# Patient Record
Sex: Male | Born: 1975 | Race: Black or African American | Hispanic: No | Smoking: Current every day smoker
Health system: Southern US, Community
[De-identification: ages and names within clinical notes are randomized; demographics above are authoritative.]

## PROBLEM LIST (undated history)

## (undated) DIAGNOSIS — I10 Essential (primary) hypertension: Secondary | ICD-10-CM

## (undated) DIAGNOSIS — F329 Major depressive disorder, single episode, unspecified: Secondary | ICD-10-CM

## (undated) DIAGNOSIS — M549 Dorsalgia, unspecified: Secondary | ICD-10-CM

## (undated) DIAGNOSIS — E669 Obesity, unspecified: Secondary | ICD-10-CM

## (undated) DIAGNOSIS — F319 Bipolar disorder, unspecified: Secondary | ICD-10-CM

## (undated) DIAGNOSIS — F209 Schizophrenia, unspecified: Secondary | ICD-10-CM

## (undated) DIAGNOSIS — F32A Depression, unspecified: Secondary | ICD-10-CM

## (undated) DIAGNOSIS — G4730 Sleep apnea, unspecified: Secondary | ICD-10-CM

## (undated) DIAGNOSIS — G8929 Other chronic pain: Secondary | ICD-10-CM

## (undated) DIAGNOSIS — R0602 Shortness of breath: Secondary | ICD-10-CM

## (undated) HISTORY — PX: CHOLECYSTECTOMY: SHX55

---

## 1999-03-03 ENCOUNTER — Encounter: Payer: Self-pay | Admitting: Emergency Medicine

## 1999-03-03 ENCOUNTER — Emergency Department (HOSPITAL_COMMUNITY): Admission: EM | Admit: 1999-03-03 | Discharge: 1999-03-03 | Payer: Self-pay | Admitting: Emergency Medicine

## 2003-04-21 ENCOUNTER — Emergency Department (HOSPITAL_COMMUNITY): Admission: EM | Admit: 2003-04-21 | Discharge: 2003-04-21 | Payer: Self-pay | Admitting: Emergency Medicine

## 2004-08-18 ENCOUNTER — Emergency Department (HOSPITAL_COMMUNITY): Admission: EM | Admit: 2004-08-18 | Discharge: 2004-08-18 | Payer: Self-pay | Admitting: Emergency Medicine

## 2006-02-27 ENCOUNTER — Emergency Department (HOSPITAL_COMMUNITY): Admission: EM | Admit: 2006-02-27 | Discharge: 2006-02-27 | Payer: Self-pay | Admitting: Emergency Medicine

## 2007-02-22 ENCOUNTER — Emergency Department (HOSPITAL_COMMUNITY): Admission: EM | Admit: 2007-02-22 | Discharge: 2007-02-22 | Payer: Self-pay | Admitting: Emergency Medicine

## 2007-06-19 ENCOUNTER — Emergency Department (HOSPITAL_COMMUNITY): Admission: EM | Admit: 2007-06-19 | Discharge: 2007-06-19 | Payer: Self-pay | Admitting: Emergency Medicine

## 2007-07-18 ENCOUNTER — Emergency Department (HOSPITAL_COMMUNITY): Admission: EM | Admit: 2007-07-18 | Discharge: 2007-07-18 | Payer: Self-pay | Admitting: Emergency Medicine

## 2007-07-20 ENCOUNTER — Emergency Department (HOSPITAL_COMMUNITY): Admission: EM | Admit: 2007-07-20 | Discharge: 2007-07-20 | Payer: Self-pay | Admitting: Emergency Medicine

## 2007-08-29 ENCOUNTER — Emergency Department (HOSPITAL_COMMUNITY): Admission: EM | Admit: 2007-08-29 | Discharge: 2007-08-30 | Payer: Self-pay | Admitting: Emergency Medicine

## 2007-10-14 ENCOUNTER — Emergency Department (HOSPITAL_COMMUNITY): Admission: EM | Admit: 2007-10-14 | Discharge: 2007-10-14 | Payer: Self-pay | Admitting: Emergency Medicine

## 2007-10-30 ENCOUNTER — Emergency Department (HOSPITAL_COMMUNITY): Admission: EM | Admit: 2007-10-30 | Discharge: 2007-10-31 | Payer: Self-pay | Admitting: Emergency Medicine

## 2007-12-02 ENCOUNTER — Emergency Department (HOSPITAL_COMMUNITY): Admission: EM | Admit: 2007-12-02 | Discharge: 2007-12-03 | Payer: Self-pay | Admitting: Emergency Medicine

## 2007-12-07 ENCOUNTER — Inpatient Hospital Stay (HOSPITAL_COMMUNITY): Admission: RE | Admit: 2007-12-07 | Discharge: 2007-12-08 | Payer: Self-pay | Admitting: General Surgery

## 2007-12-08 ENCOUNTER — Encounter (INDEPENDENT_AMBULATORY_CARE_PROVIDER_SITE_OTHER): Payer: Self-pay | Admitting: General Surgery

## 2008-03-16 ENCOUNTER — Emergency Department (HOSPITAL_COMMUNITY): Admission: EM | Admit: 2008-03-16 | Discharge: 2008-03-17 | Payer: Self-pay | Admitting: Emergency Medicine

## 2008-05-26 ENCOUNTER — Emergency Department (HOSPITAL_COMMUNITY): Admission: EM | Admit: 2008-05-26 | Discharge: 2008-05-26 | Payer: Self-pay | Admitting: Emergency Medicine

## 2008-08-21 ENCOUNTER — Emergency Department (HOSPITAL_COMMUNITY): Admission: EM | Admit: 2008-08-21 | Discharge: 2008-08-21 | Payer: Self-pay | Admitting: Emergency Medicine

## 2008-09-30 ENCOUNTER — Emergency Department (HOSPITAL_COMMUNITY): Admission: EM | Admit: 2008-09-30 | Discharge: 2008-09-30 | Payer: Self-pay | Admitting: Emergency Medicine

## 2008-10-15 ENCOUNTER — Emergency Department (HOSPITAL_COMMUNITY): Admission: EM | Admit: 2008-10-15 | Discharge: 2008-10-15 | Payer: Self-pay | Admitting: Emergency Medicine

## 2008-11-18 ENCOUNTER — Emergency Department (HOSPITAL_COMMUNITY): Admission: EM | Admit: 2008-11-18 | Discharge: 2008-11-18 | Payer: Self-pay | Admitting: Emergency Medicine

## 2008-12-04 ENCOUNTER — Emergency Department (HOSPITAL_COMMUNITY): Admission: EM | Admit: 2008-12-04 | Discharge: 2008-12-04 | Payer: Self-pay | Admitting: Emergency Medicine

## 2009-01-03 ENCOUNTER — Emergency Department (HOSPITAL_COMMUNITY): Admission: EM | Admit: 2009-01-03 | Discharge: 2009-01-03 | Payer: Self-pay | Admitting: Emergency Medicine

## 2010-01-17 ENCOUNTER — Emergency Department (HOSPITAL_COMMUNITY)
Admission: EM | Admit: 2010-01-17 | Discharge: 2010-01-18 | Payer: Self-pay | Source: Home / Self Care | Admitting: Emergency Medicine

## 2010-02-20 ENCOUNTER — Emergency Department (HOSPITAL_COMMUNITY)
Admission: EM | Admit: 2010-02-20 | Discharge: 2010-02-20 | Payer: Self-pay | Source: Home / Self Care | Admitting: Emergency Medicine

## 2010-04-12 LAB — COMPREHENSIVE METABOLIC PANEL
ALT: 34 U/L (ref 0–53)
Alkaline Phosphatase: 71 U/L (ref 39–117)
CO2: 30 mEq/L (ref 19–32)
Creatinine, Ser: 1.27 mg/dL (ref 0.4–1.5)
GFR calc Af Amer: >60 mL/min (ref >60)
Potassium: 2.9 mEq/L — ABNORMAL LOW (ref 3.5–5.1)

## 2010-04-12 LAB — URINE MICROSCOPIC-ADD ON

## 2010-04-12 LAB — URINALYSIS, ROUTINE W REFLEX MICROSCOPIC
Ketones, ur: 15 mg/dL — AB
Leukocytes, UA: NEGATIVE
Protein, ur: NEGATIVE mg/dL
Specific Gravity, Urine: 1.022 (ref 1.005–1.030)
Urobilinogen, UA: 1 mg/dL (ref 0.0–1.0)

## 2010-04-12 LAB — CBC
Hemoglobin: 13.4 g/dL (ref 13.0–17.0)
MCH: 29.6 pg (ref 26.0–34.0)
MCHC: 33.8 g/dL (ref 30.0–36.0)
RDW: 12.7 % (ref 11.5–15.5)
WBC: 10.3 10*3/uL (ref 4.0–10.5)

## 2010-04-12 LAB — DIFFERENTIAL
Eosinophils Absolute: 0.1 10*3/uL (ref 0.0–0.7)
Lymphs Abs: 3.6 10*3/uL (ref 0.7–4.0)

## 2010-05-06 LAB — CBC
HCT: 39 % (ref 39.0–52.0)
Hemoglobin: 13.4 g/dL (ref 13.0–17.0)
MCHC: 34.2 g/dL (ref 30.0–36.0)
MCV: 90.2 fL (ref 78.0–100.0)
RBC: 4.32 MIL/uL (ref 4.22–5.81)
WBC: 12.2 10*3/uL — ABNORMAL HIGH (ref 4.0–10.5)

## 2010-05-06 LAB — POCT I-STAT, CHEM 8
BUN: 14 mg/dL (ref 6–23)
Calcium, Ion: 1.12 mmol/L (ref 1.12–1.32)
Hemoglobin: 13.9 g/dL (ref 13.0–17.0)
TCO2: 23 mmol/L (ref 0–100)

## 2010-05-06 LAB — DIFFERENTIAL
Basophils Absolute: 0.1 10*3/uL (ref 0.0–0.1)
Eosinophils Absolute: 0.2 10*3/uL (ref 0.0–0.7)
Eosinophils Relative: 1 % (ref 0–5)
Lymphocytes Relative: 30 % (ref 12–46)
Monocytes Absolute: 0.8 10*3/uL (ref 0.1–1.0)

## 2010-05-06 LAB — URINALYSIS, ROUTINE W REFLEX MICROSCOPIC
Bilirubin Urine: NEGATIVE
Glucose, UA: NEGATIVE mg/dL
Nitrite: NEGATIVE
pH: 5.5 (ref 5.0–8.0)

## 2010-05-18 LAB — RAPID URINE DRUG SCREEN, HOSP PERFORMED
Amphetamines: NOT DETECTED
Barbiturates: NOT DETECTED
Tetrahydrocannabinol: NOT DETECTED

## 2010-06-15 NOTE — Op Note (Signed)
NAMEJESTIN, BURBACH               ACCOUNT NO.:  [id number]   MEDICAL RECORD NO.:  [id number]          PATIENT TYPE:  INP   LOCATION:  5127                         FACILITY:  [facility]   PHYSICIAN:  Lennie Muckle, MD      DATE OF BIRTH:  1975-03-08   DATE OF PROCEDURE:  DATE OF DISCHARGE:  12/08/2007                               OPERATIVE REPORT   PREOPERATIVE DIAGNOSIS:  Cholecystitis.   POSTOPERATIVE DIAGNOSIS:  Cholecystitis.   PROCEDURE:  Laparoscopic cholecystectomy.   SURGEON:  Lennie Muckle, MD   ASSISTANT:  None.   ANESTHESIA:  General endotracheal.   SPECIMENS:  Gallbladder.   FINDINGS:  Inflamed thickened gallbladder wall, multiple stones.   ESTIMATED BLOOD LOSS:  150 mL.   COMPLICATIONS:  No immediate complications.   DRAINS:  No drains were placed.   INDICATION FOR PROCEDURE:  Mr. Alyea is a 35 year old male who is  actually seen in the [facility] Emergency Department last Sunday.  He  did have right upper quadrant pain and elevated white count of 17,000.  Ultrasound revealed multiple stones within the gallbladder.  He was sent  home with pain medications and followed up with Dr. Consuello Bossier.  He continued to have epigastric discomfort right upper quadrant pain.  Findings were consistent with cholecystitis.  He was therefore admitted  for laparoscopic cholecystectomy.  Risk of the surgery included, but not  limited to bleeding, infection, biloma, hematoma, common bile duct  injury were explained to the patient.  Informed consent was obtained  prior to the procedure.  He was given Unasyn preoperatively.   DETAILS OF PROCEDURE:  Mr. Kraeger was identified in the preoperative  holding area.  All questions were answered.  He was taken to the  operating room and placed in supine position.  After administration of  general endotracheal anesthesia, his abdomen was prepped and draped in  the usual sterile fashion.  A time-out procedure indicating the patient  and procedure was performed.  I had palpated the umbilicus and felt  there was a small fascial defect.  Therefore, I placed an incision  immediately at the umbilicus.  I placed a Veress needle into the  abdominal cavity after obtaining pneumoperitoneum, I placed 11-mm trocar  in the abdomen at the umbilicus.  Visualized abdominal wall upon entry.  Inspection of the abdomen revealed no evidence of injury placement of  Veress or the trocar.  The patient was then placed in reverse  Trendelenburg left side down.  I then placed 3 ports in the right side  of the abdomen and visualization with the camera the gallbladder was  noted to be rather distended.  There were adhesions of omentum to the  gallbladder.  I gently dissected these with blunt dissection.  I  decompressed the gallbladder using the laparoscopic suction irrigator.  I then grasped the fundus and retracted the head of the patient.  The  gallbladder wall was thickened and made the dissection somewhat  difficult.  The peritoneum surrounding the gallbladder was also inflamed  and very friable.  Using the suction irrigator, hook electrocautery, and  [place], I carefully dissected around the infundibulum of the  gallbladder.  I dissected around the cystic node and pushed this  inferiorly.  I continued to dissect around the infundibulum and I was  able to isolate the cystic duct.  I located the cystic artery, which was  immediately posteromedial to the cystic duct.  I placed the clips on  this proximally and ligated this with the electrocautery distally.  I  obtained a critical view of the cystic duct and the liver bed.  I then  placed 2 clips proximally 1 distally and transected the cystic duct.  No  stones were palpated or seen in the cystic duct.  I then continued with  the dissection of the remaining peritoneum of the gallbladder.  Due to  the acute inflammation, the peritoneum was friable and the liver bed was  somewhat oozy. Using  the hook electrocautery as well as just blunt  dissection, I was able to fully dissect the gallbladder.  I placed the  gallbladder within the EndoCatch bag.  I inspected the liver bed, there  were small amount of oozing due to the inflammation.  Using  electrocautery, this was easily controlled.  I removed the bag from the  umbilical incision.  The bag did break during removal.  There was no  spillage of bile.  I had to lengthen the incision of the fascia with #11  blade.  I then removed the gallbladder.  I then once again inspected the  abdomen.  The liver bed appeared without evidence of bleeding, no bile  leak.  I irrigated the abdomen with 1 liter of saline.  I then closed  the fascial defect at the umbilicus with 0 Vicryl suture using figure-of-  eight sutures.  After closure of this, I inspected the abdomen once  again.  There was no evidence of injury and no bleeding.  I then  released the pneumoperitoneum.  Trocars were removed.  Skin was closed  with 4-0 Monocryl.  I did irrigate the umbilicus with saline.  Dermabond  placed for final dressing.  The patient was extubated, transported to  post anesthesia care unit in stable condition.  He will be monitored for  few hours if pain is controlled in keeping liquids down, he will be able  to be discharged home.      Lennie Muckle, MD  Electronically Signed     ALA/MEDQ  D:  12/08/2007  T:  12/09/2007  Job:  [id number]

## 2010-06-18 NOTE — Discharge Summary (Signed)
NAMECRISTHIAN, VANHOOK               ACCOUNT NO.:  [id number]   MEDICAL RECORD NO.:  [id number]          PATIENT TYPE:  INP   LOCATION:  5127                         FACILITY:  [facility]   PHYSICIAN:  Lennie Muckle, MD      DATE OF BIRTH:  08/13/1975   DATE OF ADMISSION:  12/07/2007  DATE OF DISCHARGE:  12/08/2007                               DISCHARGE SUMMARY   PROCEDURE:  On December 08, 2007, laparoscopic cholecystectomy and  cholangiogram.   HOSPITAL COURSE:  Mr. Biehl was admitted on Friday for cholecystitis.  He received cholecystectomy on Saturday.  He had an uneventful  postoperative course.  He is going to be discharged and sent home on the  same day without problem.  Pain controlled and he is having adequate  intake.      Lennie Muckle, MD  Electronically Signed     ALA/MEDQ  D:  01/23/2008  T:  01/24/2008  Job:  [id number]

## 2010-06-20 ENCOUNTER — Emergency Department (HOSPITAL_COMMUNITY): Payer: Self-pay

## 2010-06-20 ENCOUNTER — Emergency Department (HOSPITAL_COMMUNITY)
Admission: EM | Admit: 2010-06-20 | Discharge: 2010-06-20 | Disposition: A | Discharge status: Home / Self Care | Payer: Self-pay | Attending: Emergency Medicine | Admitting: Emergency Medicine

## 2010-06-20 DIAGNOSIS — K219 Gastro-esophageal reflux disease without esophagitis: Secondary | ICD-10-CM | POA: Insufficient documentation

## 2010-06-20 DIAGNOSIS — Y92009 Unspecified place in unspecified non-institutional (private) residence as the place of occurrence of the external cause: Secondary | ICD-10-CM | POA: Insufficient documentation

## 2010-06-20 DIAGNOSIS — W010XXA Fall on same level from slipping, tripping and stumbling without subsequent striking against object, initial encounter: Secondary | ICD-10-CM | POA: Insufficient documentation

## 2010-06-20 DIAGNOSIS — J069 Acute upper respiratory infection, unspecified: Secondary | ICD-10-CM | POA: Insufficient documentation

## 2010-06-20 DIAGNOSIS — Z79899 Other long term (current) drug therapy: Secondary | ICD-10-CM | POA: Insufficient documentation

## 2010-06-20 DIAGNOSIS — G8929 Other chronic pain: Secondary | ICD-10-CM | POA: Insufficient documentation

## 2010-06-20 DIAGNOSIS — M25579 Pain in unspecified ankle and joints of unspecified foot: Secondary | ICD-10-CM | POA: Insufficient documentation

## 2010-06-20 DIAGNOSIS — R030 Elevated blood-pressure reading, without diagnosis of hypertension: Secondary | ICD-10-CM | POA: Insufficient documentation

## 2010-06-20 DIAGNOSIS — M25569 Pain in unspecified knee: Secondary | ICD-10-CM | POA: Insufficient documentation

## 2010-09-26 ENCOUNTER — Emergency Department (HOSPITAL_COMMUNITY): Payer: Self-pay

## 2010-09-26 ENCOUNTER — Emergency Department (HOSPITAL_COMMUNITY)
Admission: EM | Admit: 2010-09-26 | Discharge: 2010-09-26 | Disposition: A | Discharge status: Home / Self Care | Payer: Self-pay | Attending: Emergency Medicine | Admitting: Emergency Medicine

## 2010-09-26 DIAGNOSIS — M79609 Pain in unspecified limb: Secondary | ICD-10-CM | POA: Insufficient documentation

## 2010-09-26 DIAGNOSIS — M25519 Pain in unspecified shoulder: Secondary | ICD-10-CM | POA: Insufficient documentation

## 2010-09-26 DIAGNOSIS — M25539 Pain in unspecified wrist: Secondary | ICD-10-CM | POA: Insufficient documentation

## 2010-09-26 DIAGNOSIS — S62329A Displaced fracture of shaft of unspecified metacarpal bone, initial encounter for closed fracture: Secondary | ICD-10-CM | POA: Insufficient documentation

## 2010-09-26 DIAGNOSIS — M25529 Pain in unspecified elbow: Secondary | ICD-10-CM | POA: Insufficient documentation

## 2010-09-26 DIAGNOSIS — IMO0002 Reserved for concepts with insufficient information to code with codable children: Secondary | ICD-10-CM | POA: Insufficient documentation

## 2010-09-26 DIAGNOSIS — K219 Gastro-esophageal reflux disease without esophagitis: Secondary | ICD-10-CM | POA: Insufficient documentation

## 2010-09-26 DIAGNOSIS — M542 Cervicalgia: Secondary | ICD-10-CM | POA: Insufficient documentation

## 2010-09-26 DIAGNOSIS — L909 Atrophic disorder of skin, unspecified: Secondary | ICD-10-CM | POA: Insufficient documentation

## 2010-10-28 LAB — POCT I-STAT, CHEM 8
HCT: 38 — ABNORMAL LOW
Hemoglobin: 12.9 — ABNORMAL LOW
Potassium: 3.5
Sodium: 138

## 2010-11-02 LAB — URINE MICROSCOPIC-ADD ON

## 2010-11-02 LAB — COMPREHENSIVE METABOLIC PANEL
ALT: 15
Albumin: 4
Albumin: 3.5
Alkaline Phosphatase: 71
BUN: 6
BUN: 5 — ABNORMAL LOW
CO2: 28
Chloride: 107
Creatinine, Ser: 1.19
GFR calc non Af Amer: >60
Glucose, Bld: 90
Potassium: 3.9
Sodium: 140
Total Bilirubin: 0.5
Total Protein: 6.8

## 2010-11-02 LAB — URINALYSIS, ROUTINE W REFLEX MICROSCOPIC
Leukocytes, UA: NEGATIVE
Nitrite: NEGATIVE
Specific Gravity, Urine: 1.029
Urobilinogen, UA: 1

## 2010-11-02 LAB — DIFFERENTIAL
Basophils Relative: 0
Monocytes Absolute: 0.9
Monocytes Relative: 5
Neutro Abs: 14 — ABNORMAL HIGH

## 2010-11-02 LAB — CBC
HCT: 37.9 — ABNORMAL LOW
MCV: 91.9
Platelets: 362
Platelets: 329
RDW: 12.7
WBC: 9

## 2010-12-14 ENCOUNTER — Emergency Department (HOSPITAL_COMMUNITY): Payer: Self-pay

## 2010-12-14 ENCOUNTER — Emergency Department (HOSPITAL_COMMUNITY)
Admission: EM | Admit: 2010-12-14 | Discharge: 2010-12-14 | Disposition: A | Discharge status: Home / Self Care | Payer: Self-pay | Attending: Emergency Medicine | Admitting: Emergency Medicine

## 2010-12-14 ENCOUNTER — Encounter: Payer: Self-pay | Admitting: Emergency Medicine

## 2010-12-14 DIAGNOSIS — R109 Unspecified abdominal pain: Secondary | ICD-10-CM | POA: Insufficient documentation

## 2010-12-14 DIAGNOSIS — E669 Obesity, unspecified: Secondary | ICD-10-CM | POA: Insufficient documentation

## 2010-12-14 HISTORY — DX: Obesity, unspecified: E66.9

## 2010-12-14 LAB — CBC
Platelets: 340 10*3/uL (ref 150–400)
RBC: 4.54 MIL/uL (ref 4.22–5.81)
RDW: 12.9 % (ref 11.5–15.5)
WBC: 10.6 10*3/uL — ABNORMAL HIGH (ref 4.0–10.5)

## 2010-12-14 LAB — DIFFERENTIAL
Basophils Absolute: 0.1 10*3/uL (ref 0.0–0.1)
Eosinophils Absolute: 0.1 10*3/uL (ref 0.0–0.7)
Lymphocytes Relative: 28 % (ref 12–46)
Lymphs Abs: 3 10*3/uL (ref 0.7–4.0)
Neutrophils Relative %: 65 % (ref 43–77)

## 2010-12-14 LAB — URINALYSIS, ROUTINE W REFLEX MICROSCOPIC
Nitrite: NEGATIVE
Specific Gravity, Urine: 1.028 (ref 1.005–1.030)
Urobilinogen, UA: 1 mg/dL (ref 0.0–1.0)

## 2010-12-14 LAB — URINE MICROSCOPIC-ADD ON

## 2010-12-14 LAB — BASIC METABOLIC PANEL
BUN: 13 mg/dL (ref 6–23)
GFR calc Af Amer: 75 mL/min — ABNORMAL LOW (ref >90)
GFR calc non Af Amer: 64 mL/min — ABNORMAL LOW (ref >90)
Potassium: 3 mEq/L — ABNORMAL LOW (ref 3.5–5.1)

## 2010-12-14 MED ORDER — HYDROMORPHONE HCL PF 1 MG/ML IJ SOLN
1.0000 mg | Freq: Once | INTRAMUSCULAR | Status: AC
  Administered 2010-12-14: 1 mg via INTRAVENOUS
  Filled 2010-12-14: qty 1

## 2010-12-14 MED ORDER — ONDANSETRON HCL 4 MG/2ML IJ SOLN
4.0000 mg | Freq: Once | INTRAMUSCULAR | Status: AC
  Administered 2010-12-14: 4 mg via INTRAVENOUS
  Filled 2010-12-14: qty 2

## 2010-12-14 MED ORDER — ACETAMINOPHEN-CODEINE #3 300-30 MG PO TABS: 1.0000 | ORAL_TABLET | Freq: Four times a day (QID) | ORAL | Status: AC | PRN

## 2010-12-14 MED ORDER — IOHEXOL 300 MG/ML  SOLN
80.0000 mL | Freq: Once | INTRAMUSCULAR | Status: AC | PRN
  Administered 2010-12-14: 80 mL via INTRAVENOUS

## 2010-12-14 NOTE — ED Notes (Signed)
PT. REPORTS GENERALIZED ABDOMINAL PAIN X SEVERAL MONTHS WORSE THIS EVENING ,  NO NAUSEA OR VOMITTING ,  OCCASIONAL DIARRHEA ,  NO FEVER OR CHILLS.

## 2010-12-14 NOTE — ED Provider Notes (Signed)
History     CSN: [id number] Arrival date & time: 12/14/2010  3:41 AM   First MD Initiated Contact with Patient 12/14/10 [phone]      Chief Complaint  Patient presents with  . Abdominal Pain    (Consider location/radiation/quality/duration/timing/severity/associated sxs/prior treatment) HPI  Patient who states he had cholecystectomy 2 years ago by Dr. Bertram Savin complains of a 2 year hx of intermittent waxing and waning diffuse abdominal pain since having that procedure stating that he returned to see Dr. Freida Busman shortly after surgery but has not followed up since. Patient states that since last night increasing severity of abdominal pain. States some loose stool occassionally but denies nausea or vomiting. Denies known fevers or chills. Patient has not taken anything PTA for pain. States pain is aggravated by eating. Denies alleviating factors.   Past Medical History  Diagnosis Date  . Obesity     Past Surgical History  Procedure Date  . Cholecystectomy     No family history on file.  History  Substance Use Topics  . Smoking status: Current Everyday Smoker  . Smokeless tobacco: Not on file  . Alcohol Use: Yes     OCCASIONAL      Review of Systems  All other systems reviewed and are negative.    Allergies  Hydrocodone  Home Medications  No current outpatient prescriptions on file.  BP 161/104  Pulse 88  Temp(Src) 98.8 F (37.1 C) (Oral)  Resp 17  SpO2 93%  Physical Exam  Nursing note and vitals reviewed. Constitutional: He is oriented to person, place, and time. He appears well-developed and well-nourished.  Non-toxic appearance. He does not have a sickly appearance. No distress.       obese  HENT:  Head: Normocephalic and atraumatic.  Eyes: Conjunctivae are normal.  Neck: Normal range of motion. Neck supple.  Cardiovascular: Normal rate, regular rhythm, normal heart sounds and intact distal pulses.  Exam reveals no gallop and no friction rub.   No  murmur heard. Pulmonary/Chest: Effort normal and breath sounds normal. No respiratory distress. He has no wheezes. He has no rales. He exhibits no tenderness.  Abdominal: Bowel sounds are normal. He exhibits no distension and no mass. There is tenderness. There is no rebound and no guarding.       Diffuse abdominal TTP but greatest pain in lower abdomen. No peritoneal signs and no rebound.   Musculoskeletal: Normal range of motion. He exhibits no edema and no tenderness.  Neurological: He is alert and oriented to person, place, and time.  Skin: Skin is warm and dry. No rash noted. He is not diaphoretic. No erythema.  Psychiatric: He has a normal mood and affect.    ED Course  Procedures (including critical care time)  IV dilaudid and zofran  Labs Reviewed  URINALYSIS, ROUTINE W REFLEX MICROSCOPIC - Abnormal; Notable for the following:    Color, Urine AMBER (*) BIOCHEMICALS MAY BE AFFECTED BY COLOR   Bilirubin Urine SMALL (*)    Ketones, ur 15 (*)    Protein, ur 30 (*)    All other components within normal limits  CBC - Abnormal; Notable for the following:    WBC 10.6 (*)    All other components within normal limits  BASIC METABOLIC PANEL - Abnormal; Notable for the following:    Potassium 3.0 (*)    Creatinine, Ser 1.39 (*)    GFR calc non Af Amer 64 (*)    GFR calc Af Amer 75 (*)  All other components within normal limits  URINE MICROSCOPIC-ADD ON - Abnormal; Notable for the following:    Bacteria, UA FEW (*)    Casts HYALINE CASTS (*)    Crystals CA OXALATE CRYSTALS (*)    All other components within normal limits  DIFFERENTIAL  LIPASE, BLOOD   Dg Chest 1 View  12/14/2010  *RADIOLOGY REPORT*  Clinical Data: Abdominal pain.  Somnolence.  CHEST - 1 VIEW  Comparison: 06/20/2010.  Findings: Low volume chest.  Bilateral basilar atelectasis.  No airspace disease.  No effusion.  IMPRESSION: Low volume chest.  Original Report Authenticated By: Andreas Newport, M.D.   Ct Abdomen  Pelvis W Contrast  12/14/2010  *RADIOLOGY REPORT*  Clinical Data: Severe right lower quadrant pain, nausea, rebound  CT ABDOMEN AND PELVIS WITH CONTRAST  Technique:  Multidetector CT imaging of the abdomen and pelvis was performed following the standard protocol during bolus administration of intravenous contrast.  Contrast: 80mL OMNIPAQUE IOHEXOL 300 MG/ML IV SOLN  Comparison: CT abdomen pelvis of 01/17/2010  Findings: Minimal parenchymal opacity peripherally at the right lung base may reflect atelectasis.  No effusion is seen.  The liver again is noted to be low in attenuation consistent with fatty infiltration.  No focal abnormality is seen.  Surgical clips are present from prior cholecystectomy.  The pancreas is normal in size and the pancreatic duct is not dilated.  The adrenal glands and spleen are unremarkable.  The kidneys enhance with no calculus or mass and no hydronephrosis is seen.  The abdominal aorta is normal in caliber.  No adenopathy is seen.  The urinary bladder is not well distended.  The prostate is normal in size.  No fluid is seen within the pelvis.  No abnormality of the colon is seen.  The terminal ileum is unremarkable.  The appendix fills with air and there is no present evidence of acute appendicitis. No bony abnormality is seen.  IMPRESSION:  1.  No explanation for the patient's pain is seen.  The appendix is well seen and appears normal. 2.  Probable atelectasis peripherally at the right lung base. Correlate clinically.  Original Report Authenticated By: Juline Patch, M.D.     1. Abdominal pain       MDM  2 years of intermittent abdominal pain without acute findings on CT scan and patient afebrile and non toxic appearing. No peritoneal signs. Patient agreeable to following up with PCP for recheck of recurrent abdominal pain and recheck of blood pressure.         Lenon Oms [place], [place] 12/14/10 [phone]

## 2010-12-14 NOTE — ED Provider Notes (Signed)
Medical screening examination/treatment/procedure(s) were performed by non-physician practitioner and as supervising physician I was immediately available for consultation/collaboration.  Olivia Mackie, MD 12/14/10 [phone]

## 2010-12-14 NOTE — ED Notes (Signed)
Pt. Discharged to home with wife pt. Alert and oriented, NAD noted

## 2010-12-14 NOTE — ED Notes (Signed)
Pt.bulated to bathroom with 1 assist. Gait slightly unsteady, pt. Alert and oriented, returned to stretcher, awaiting CT results

## 2011-05-24 ENCOUNTER — Inpatient Hospital Stay (HOSPITAL_COMMUNITY)
Admission: EM | Admit: 2011-05-24 | Discharge: 2011-05-26 | DRG: 189 | Discharge status: Against Medical Advice | Payer: Self-pay | Attending: Internal Medicine | Admitting: Internal Medicine

## 2011-05-24 ENCOUNTER — Encounter (HOSPITAL_COMMUNITY): Payer: Self-pay | Admitting: Emergency Medicine

## 2011-05-24 ENCOUNTER — Emergency Department (HOSPITAL_COMMUNITY): Payer: Self-pay

## 2011-05-24 DIAGNOSIS — G4730 Sleep apnea, unspecified: Secondary | ICD-10-CM

## 2011-05-24 DIAGNOSIS — E876 Hypokalemia: Secondary | ICD-10-CM

## 2011-05-24 DIAGNOSIS — F1910 Other psychoactive substance abuse, uncomplicated: Secondary | ICD-10-CM

## 2011-05-24 DIAGNOSIS — I10 Essential (primary) hypertension: Secondary | ICD-10-CM | POA: Diagnosis present

## 2011-05-24 DIAGNOSIS — Z6841 Body Mass Index (BMI) 40.0 and over, adult: Secondary | ICD-10-CM

## 2011-05-24 DIAGNOSIS — R0681 Apnea, not elsewhere classified: Secondary | ICD-10-CM | POA: Diagnosis present

## 2011-05-24 DIAGNOSIS — F1410 Cocaine abuse, uncomplicated: Secondary | ICD-10-CM

## 2011-05-24 DIAGNOSIS — G929 Unspecified toxic encephalopathy: Secondary | ICD-10-CM | POA: Diagnosis present

## 2011-05-24 DIAGNOSIS — R4182 Altered mental status, unspecified: Secondary | ICD-10-CM

## 2011-05-24 DIAGNOSIS — F1510 Other stimulant abuse, uncomplicated: Secondary | ICD-10-CM | POA: Diagnosis present

## 2011-05-24 DIAGNOSIS — J9600 Acute respiratory failure, unspecified whether with hypoxia or hypercapnia: Principal | ICD-10-CM | POA: Diagnosis present

## 2011-05-24 DIAGNOSIS — G92 Toxic encephalopathy: Secondary | ICD-10-CM | POA: Diagnosis present

## 2011-05-24 DIAGNOSIS — M6282 Rhabdomyolysis: Secondary | ICD-10-CM | POA: Diagnosis present

## 2011-05-24 HISTORY — DX: Shortness of breath: R06.02

## 2011-05-24 HISTORY — DX: Essential (primary) hypertension: I10

## 2011-05-24 LAB — CBC
HCT: 43.8 % (ref 39.0–52.0)
Hemoglobin: 15.2 g/dL (ref 13.0–17.0)
MCH: 30.2 pg (ref 26.0–34.0)
MCHC: 34.7 g/dL (ref 30.0–36.0)
MCV: 86.9 fL (ref 78.0–100.0)

## 2011-05-24 LAB — POCT I-STAT 3, ART BLOOD GAS (G3+)
Acid-Base Excess: 3 mmol/L — ABNORMAL HIGH (ref 0.0–2.0)
Bicarbonate: 27.5 mEq/L — ABNORMAL HIGH (ref 20.0–24.0)
O2 Saturation: 99 %
Patient temperature: 98.6
TCO2: 29 mmol/L (ref 0–100)
pO2, Arterial: 118 mmHg — ABNORMAL HIGH (ref 80.0–100.0)

## 2011-05-24 LAB — CARDIAC PANEL(CRET KIN+CKTOT+MB+TROPI)
CK, MB: 7.6 ng/mL (ref 0.3–4.0)
Troponin I: <0.3 ng/mL (ref <0.30)

## 2011-05-24 LAB — COMPREHENSIVE METABOLIC PANEL
Albumin: 4.5 g/dL (ref 3.5–5.2)
BUN: 11 mg/dL (ref 6–23)
Creatinine, Ser: 1.5 mg/dL — ABNORMAL HIGH (ref 0.50–1.35)
GFR calc Af Amer: 68 mL/min — ABNORMAL LOW (ref >90)
Glucose, Bld: 79 mg/dL (ref 70–99)
Total Bilirubin: 0.4 mg/dL (ref 0.3–1.2)
Total Protein: 8.1 g/dL (ref 6.0–8.3)

## 2011-05-24 LAB — DIFFERENTIAL
Basophils Relative: 0 % (ref 0–1)
Eosinophils Absolute: 0.2 10*3/uL (ref 0.0–0.7)
Eosinophils Relative: 1 % (ref 0–5)
Monocytes Absolute: 0.5 10*3/uL (ref 0.1–1.0)
Monocytes Relative: 4 % (ref 3–12)

## 2011-05-24 LAB — RAPID URINE DRUG SCREEN, HOSP PERFORMED
Cocaine: POSITIVE — AB
Opiates: NOT DETECTED
Tetrahydrocannabinol: NOT DETECTED

## 2011-05-24 LAB — GLUCOSE, CAPILLARY: Glucose-Capillary: 87 mg/dL (ref 70–99)

## 2011-05-24 LAB — CK: Total CK: 1588 U/L — ABNORMAL HIGH (ref 7–232)

## 2011-05-24 LAB — ETHANOL: Alcohol, Ethyl (B): <11 mg/dL (ref 0–11)

## 2011-05-24 MED ORDER — HEPARIN SODIUM (PORCINE) 5000 UNIT/ML IJ SOLN
5000.0000 [IU] | Freq: Three times a day (TID) | INTRAMUSCULAR | Status: DC
  Administered 2011-05-24 – 2011-05-26 (×7): 5000 [IU] via SUBCUTANEOUS
  Filled 2011-05-24 (×9): qty 1

## 2011-05-24 MED ORDER — POTASSIUM CHLORIDE IN NACL 40-0.9 MEQ/L-% IV SOLN
INTRAVENOUS | Status: DC
  Administered 2011-05-24 – 2011-05-25 (×2): via INTRAVENOUS
  Filled 2011-05-24 (×7): qty 1000

## 2011-05-24 MED ORDER — NALOXONE HCL 1 MG/ML IJ SOLN
INTRAMUSCULAR | Status: AC
  Administered 2011-05-24: 09:00:00
  Filled 2011-05-24: qty 2

## 2011-05-24 MED ORDER — POTASSIUM CHLORIDE 10 MEQ/100ML IV SOLN
10.0000 meq | Freq: Once | INTRAVENOUS | Status: AC
  Administered 2011-05-24: 10 meq via INTRAVENOUS
  Filled 2011-05-24: qty 100

## 2011-05-24 MED ORDER — POTASSIUM CHLORIDE CRYS ER 20 MEQ PO TBCR: 60.0000 meq | EXTENDED_RELEASE_TABLET | Freq: Once | ORAL | Status: DC

## 2011-05-24 MED ORDER — SODIUM CHLORIDE 0.9 % IV BOLUS (SEPSIS)
1000.0000 mL | Freq: Once | INTRAVENOUS | Status: AC
  Administered 2011-05-24: 1000 mL via INTRAVENOUS

## 2011-05-24 MED ORDER — NALOXONE HCL 0.4 MG/ML IJ SOLN: 0.4000 mg | Freq: Once | INTRAMUSCULAR | Status: DC

## 2011-05-24 MED ORDER — HYDRALAZINE HCL 20 MG/ML IJ SOLN
10.0000 mg | INTRAMUSCULAR | Status: DC | PRN
  Administered 2011-05-24: 10 mg via INTRAVENOUS
  Filled 2011-05-24: qty 0.5

## 2011-05-24 MED ORDER — POTASSIUM CHLORIDE 10 MEQ/100ML IV SOLN
10.0000 meq | INTRAVENOUS | Status: AC
  Administered 2011-05-24 (×3): 10 meq via INTRAVENOUS
  Filled 2011-05-24 (×2): qty 100

## 2011-05-24 MED ORDER — HYDRALAZINE HCL 20 MG/ML IJ SOLN
20.0000 mg | INTRAMUSCULAR | Status: DC | PRN
  Administered 2011-05-24 (×2): 20 mg via INTRAVENOUS
  Filled 2011-05-24 (×4): qty 1

## 2011-05-24 MED ORDER — ENOXAPARIN SODIUM 40 MG/0.4ML ~~LOC~~ SOLN: 40.0000 mg | SUBCUTANEOUS | Status: DC

## 2011-05-24 NOTE — ED Notes (Signed)
Critical Care MD at bedside.  
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
## 2011-05-24 NOTE — ED Notes (Signed)
Family at bedside. 

## 2011-05-24 NOTE — ED Notes (Signed)
Pt increasingly difficulty to arouse, having frequent  de-sat events with apparent sleep apnea, will notify MD

## 2011-05-24 NOTE — Progress Notes (Signed)
eLink Physician-Brief Progress Note Patient Name: Ryan Horn DOB: [birth year] MRN: [id number]  Date of Service  05/24/2011   HPI/Events of Note  Pt needs DVT proph   eICU Interventions  Start Heparin Sq    Intervention Category Intermediate Interventions: Best-practice therapies (e.g. DVT, beta blocker, etc.)  Shan Levans 05/24/2011, 5:17 PM

## 2011-05-24 NOTE — Progress Notes (Signed)
eLink Physician-Brief Progress Note Patient Name: Ryan Horn DOB: 1975-08-17 MRN: [id number]  Date of Service  05/24/2011   HPI/Events of Note   Pt hypertensive  eICU Interventions  Will increase hydralazine   Intervention Category Major Interventions: Hypertension - evaluation and management  Shan Levans 05/24/2011, 5:08 PM
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
## 2011-05-24 NOTE — ED Notes (Signed)
Pt awake, asking questions, states he wants to go home. Speech slurred and pt falls asleep during sentences.
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
## 2011-05-24 NOTE — ED Notes (Signed)
[employer] in room with pt.

## 2011-05-24 NOTE — ED Notes (Signed)
RN on 2600 will return call for pt report

## 2011-05-24 NOTE — ED Notes (Signed)
Pt responds to verbal stimuli, unable to complete sentences before falling asleep. MD notified, orders given.

## 2011-05-24 NOTE — ED Notes (Signed)
Pt back from CT monitors in place

## 2011-05-24 NOTE — ED Notes (Signed)
Pt transported to CT by RN.

## 2011-05-24 NOTE — ED Notes (Signed)
Dr. Freida Busman notified of critical value
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
## 2011-05-24 NOTE — Progress Notes (Signed)
eLink Physician-Brief Progress Note Patient Name: Ryan Horn DOB: 05-02-1975 MRN: [id number]  Date of Service  05/24/2011   HPI/Events of Note   K 2.9  Will need more KCL replacement, cannot take PO  eICU Interventions  IV KCL ordered    Intervention Category Major Interventions: Electrolyte abnormality - evaluation and management  Shan Levans 05/24/2011, 5:05 PM

## 2011-05-24 NOTE — Progress Notes (Signed)
Paged to ED -08 to place on cpap, upon arrival pt. displaying OSA with sats in the 70's on 2lpm n/c, placed on PRB mask @ 15lpm sats > to the >90's, placed on V-60 Bipap mode for BUR, VT 700's when airway open,  tolerating well, RN at bedside along with [employer].

## 2011-05-24 NOTE — ED Notes (Signed)
Pt requesting visit from chaplin. Will page chaplin on call

## 2011-05-24 NOTE — H&P (Signed)
PCP:   No primary provider on file.   Chief Complaint:  ams  HPI: 36 yo man with unknown pmhx was found unresponsive in his car  He probably took some illicit substances and then had aloc. In the ED was evaluated and found to have respiratory failure and apnea and was referred for admission Cannot stay awake for longer than 1 second Non verbal   Review of Systems:  unobtainable  Past Medical History: Past Medical History  Diagnosis Date  . Obesity    Past Surgical History  Procedure Date  . Cholecystectomy     Medications: Prior to Admission medications   Not on File    Allergies:   Allergies  Allergen Reactions  . Hydrocodone Itching    'MAKES ME [facility]"    Social History:  reports that he has been smoking.  He does not have any smokeless tobacco history on file. He reports that he drinks alcohol. He reports that he uses illicit drugs.  History   Social History Narrative  . No narrative on file     Family History: No family history on file.  Physical Exam: Filed Vitals:   05/24/11 1400 05/24/11 1415 05/24/11 1430 05/24/11 1508  BP: 229/116 157/137 180/125 186/88  Pulse: 71 77 84 90  Temp:      TempSrc:      Resp:    28  Height:      Weight:      SpO2: 86% 82% 96% 99%   Morbid obesity  Conjunctiva with erythema Abdomen : soft LE plus 2 edema CVS: tachy, regular no murmur  RS: paradoxic movement Skin without rashes Neuro - unable to perform   Labs on Admission:   [facility] 05/24/11 0904  NA 143  K 2.5*  CL 102  CO2 29  GLUCOSE 79  BUN 11  CREATININE 1.50*  CALCIUM 9.5  MG --  PHOS --    Basename 05/24/11 0904  AST 36  ALT 24  ALKPHOS 93  BILITOT 0.4  PROT 8.1  ALBUMIN 4.5   No results found for this basename: LIPASE:2,AMYLASE:2 in the last 72 hours  Basename 05/24/11 0904  WBC 11.8*  NEUTROABS 7.3  HGB 15.2  HCT 43.8  MCV 86.9  PLT 377   Results for LEOR, WHYTE (MRN [id number]) as of 05/24/2011 15:38  Ref. Range  05/24/2011 15:13  Sample type No range found ARTERIAL  pH, Arterial Latest Range: 7.350-7.450  7.451 (H)  pCO2 arterial Latest Range: 35.0-45.0 mmHg 39.5  pO2, Arterial Latest Range: 80.0-100.0 mmHg 118.0 (H)  Bicarbonate Latest Range: 20.0-24.0 mEq/L 27.5 (H)  TCO2 Latest Range: 0-100 mmol/L 29  Acid-Base Excess Latest Range: 0.0-2.0 mmol/L 3.0 (H)  O2 Saturation No range found 99.0  Patient temperature No range found 98.6 F  Collection site No range found RADIAL, ALLEN'S TEST ACCEPTABLE      Basename 05/24/11 0904  CKTOTAL 1588*  CKMB --  CKMBINDEX --  TROPONINI --   No results found for this basename: TSH,T4TOTAL,FREET3,T3FREE,THYROIDAB in the last 72 hours No results found for this basename: VITAMINB12:2,FOLATE:2,FERRITIN:2,TIBC:2,IRON:2,RETICCTPCT:2 in the last 72 hours  Radiological Exams on Admission: Ct Head Wo Contrast  05/24/2011  *RADIOLOGY REPORT*  Clinical Data: Altered mental status.  Overdose.  CT HEAD WITHOUT CONTRAST  Technique:  Contiguous axial images were obtained from the base of the skull through the vertex without contrast.  Comparison: Head CT scan 03/16/2008.  Findings: Patient motion somewhat degrades the study but no evidence of intracranial  abnormality including infarction, hemorrhage, mass lesion, mass effect, midline shift or abnormal excessive fluid collection.  No hydrocephalus or pneumocephalus. Partial visualization of a mucous retention cyst or polyp right maxillary sinus noted.  IMPRESSION: No acute intracranial abnormality.  Original Report Authenticated By: Bernadene Bell. Maricela Curet, M.D.    Assessment/Plan Present on Admission:  .Altered mental status .Cocaine abuse .Hypokalemia .Polysubstance abuse .Acute respiratory failure .Apnea .Rhabdomyolysis  AMS - Probable dueTME - DDx includes also storke - although less likely due to CT scan WNL   Acute resp failure due to aloc and OSA - pCCM seen the patient and they did not recommended intubation -  patient to be started on BiPap  Hypokalemia - replete  Rhabdo - mild - ivf. F/u ck     Hameed Kolar 05/24/2011, 3:40 PM

## 2011-05-24 NOTE — ED Notes (Signed)
EMS gave pt 0.4mg  Narcan pta with no effect

## 2011-05-24 NOTE — Progress Notes (Addendum)
CRITICAL VALUE ALERT  Critical value received:  CKMB 7.6  Date of notification:  4/23  Time of notification:  1950  Critical value read back:yes  Nurse who received alert:  A. Canary Brim RN  MD notified (1st page):  Lavera Guise MD  Time of first page:  1950  MD notified (2nd page): Craige Cotta NP  Time of second page:2026  Responding MD:  Delford Field MD  Time MD responded:  2050 No new orders, will continue to monitor.

## 2011-05-24 NOTE — ED Notes (Signed)
Pt removed Bi-Pap and refused to wear it. Attempted non rebreather and pt refuses to wear. O2 5L [place] in place now. MD aware.

## 2011-05-24 NOTE — ED Notes (Signed)
Respiratory in room at this time placing pt on bi-pap for sleep apnea.

## 2011-05-24 NOTE — Progress Notes (Signed)
Chaplain's Note:  14:50 went to ED to respond to pt request.  Remained outside rm while pt having blood-draw/ treatment.  When I entered the room to introduce myself, pt was sleeping, snoring, and could not stay awake for a visit.  Advised staff we would be glad to provide support once pt is awake, and if he still requests support.  Please page if needed or requested.
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
## 2011-05-25 ENCOUNTER — Encounter (HOSPITAL_COMMUNITY): Payer: Self-pay | Admitting: *Deleted

## 2011-05-25 ENCOUNTER — Inpatient Hospital Stay (HOSPITAL_COMMUNITY): Payer: Self-pay

## 2011-05-25 DIAGNOSIS — J9600 Acute respiratory failure, unspecified whether with hypoxia or hypercapnia: Principal | ICD-10-CM

## 2011-05-25 DIAGNOSIS — R0681 Apnea, not elsewhere classified: Secondary | ICD-10-CM

## 2011-05-25 LAB — CBC
HCT: 40.2 % (ref 39.0–52.0)
MCH: 30.1 pg (ref 26.0–34.0)
MCV: 87.6 fL (ref 78.0–100.0)
RDW: 13.1 % (ref 11.5–15.5)
WBC: 14 10*3/uL — ABNORMAL HIGH (ref 4.0–10.5)

## 2011-05-25 LAB — BASIC METABOLIC PANEL
CO2: 28 mEq/L (ref 19–32)
Chloride: 105 mEq/L (ref 96–112)
Creatinine, Ser: 1.59 mg/dL — ABNORMAL HIGH (ref 0.50–1.35)
Glucose, Bld: 155 mg/dL — ABNORMAL HIGH (ref 70–99)

## 2011-05-25 LAB — CARDIAC PANEL(CRET KIN+CKTOT+MB+TROPI)
CK, MB: 5.3 ng/mL — ABNORMAL HIGH (ref 0.3–4.0)
Total CK: 752 U/L — ABNORMAL HIGH (ref 7–232)
Total CK: 551 U/L — ABNORMAL HIGH (ref 7–232)
Troponin I: <0.3 ng/mL (ref <0.30)

## 2011-05-25 MED ORDER — CLONIDINE HCL 0.1 MG PO TABS
0.1000 mg | ORAL_TABLET | Freq: Three times a day (TID) | ORAL | Status: DC
  Administered 2011-05-26: 0.1 mg via ORAL
  Filled 2011-05-25 (×4): qty 1

## 2011-05-25 MED ORDER — POTASSIUM CHLORIDE 10 MEQ/100ML IV SOLN
10.0000 meq | INTRAVENOUS | Status: AC
  Administered 2011-05-25 (×4): 10 meq via INTRAVENOUS
  Filled 2011-05-25 (×4): qty 100

## 2011-05-25 MED ORDER — HYDRALAZINE HCL 20 MG/ML IJ SOLN
10.0000 mg | INTRAMUSCULAR | Status: DC | PRN
  Administered 2011-05-25 – 2011-05-26 (×3): 10 mg via INTRAVENOUS
  Filled 2011-05-25: qty 0.5

## 2011-05-25 MED ORDER — FUROSEMIDE 10 MG/ML IJ SOLN
40.0000 mg | Freq: Two times a day (BID) | INTRAMUSCULAR | Status: DC
  Administered 2011-05-25 – 2011-05-26 (×2): 40 mg via INTRAVENOUS
  Filled 2011-05-25 (×4): qty 4

## 2011-05-25 MED ORDER — POTASSIUM CHLORIDE CRYS ER 20 MEQ PO TBCR
40.0000 meq | EXTENDED_RELEASE_TABLET | Freq: Once | ORAL | Status: AC
  Administered 2011-05-25: 40 meq via ORAL
  Filled 2011-05-25: qty 2

## 2011-05-25 NOTE — Progress Notes (Signed)
CRITICAL VALUE ALERT  Critical value received:  Troponin: 0.51  Date of notification:  *05/25/2011  Time of notification:  1050  Critical value read back:yes  Nurse who received alert:  Martin Belling, RN. Pt's nurse TATA, RN  is made aware of and she will page MD.  MD notified (1st page):    Time of first page:    MD notified (2nd page):  Time of second page:  Responding MD:    Time MD responded:

## 2011-05-25 NOTE — Plan of Care (Signed)
Problem: Phase I Progression Outcomes Goal: Pain controlled with appropriate interventions Outcome: Progressing toothache
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
## 2011-05-25 NOTE — Progress Notes (Signed)
eLink Physician-Brief Progress Note Patient Name: Ryan Horn DOB: 31-Oct-1975 MRN: [id number]  Date of Service  05/25/2011   HPI/Events of Note  Hypokalemia   eICU Interventions  Potassium replaced   Intervention Category Intermediate Interventions: Electrolyte abnormality - evaluation and management  Cari Burgo 05/25/2011, 2:31 AM

## 2011-05-25 NOTE — Progress Notes (Signed)
Utilization Review Completed.Ryan Horn T4/24/2013   
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
## 2011-05-25 NOTE — Progress Notes (Signed)
CSW aware of pt current substance abuse and aware of referral. CSW discussed with pt Md, who stated that patient is not alert and oriented at this time. CSW will continue to follow to assess for substance abuse treatment when appropriate.   .Clinical social worker continuing to follow pt to assist with pt dc plans and further csw needs.   [name], [name]  [phone] .05/25/2011 1700pm

## 2011-05-26 ENCOUNTER — Inpatient Hospital Stay (HOSPITAL_COMMUNITY): Payer: Self-pay

## 2011-05-26 LAB — BASIC METABOLIC PANEL
CO2: 27 mEq/L (ref 19–32)
Glucose, Bld: 138 mg/dL — ABNORMAL HIGH (ref 70–99)
Potassium: 3.2 mEq/L — ABNORMAL LOW (ref 3.5–5.1)
Sodium: 143 mEq/L (ref 135–145)

## 2011-05-26 LAB — CBC
Platelets: 325 10*3/uL (ref 150–400)
RDW: 13 % (ref 11.5–15.5)
WBC: 10 10*3/uL (ref 4.0–10.5)

## 2011-05-26 LAB — CARDIAC PANEL(CRET KIN+CKTOT+MB+TROPI)
CK, MB: 5.3 ng/mL — ABNORMAL HIGH (ref 0.3–4.0)
Relative Index: 1.1 (ref 0.0–2.5)
Total CK: 489 U/L — ABNORMAL HIGH (ref 7–232)

## 2011-05-26 MED ORDER — AMPICILLIN-SULBACTAM SODIUM 3 (2-1) G IJ SOLR
3.0000 g | Freq: Four times a day (QID) | INTRAMUSCULAR | Status: DC
  Administered 2011-05-26: 3 g via INTRAVENOUS
  Filled 2011-05-26 (×4): qty 3

## 2011-05-26 MED ORDER — POTASSIUM CHLORIDE CRYS ER 20 MEQ PO TBCR
40.0000 meq | EXTENDED_RELEASE_TABLET | Freq: Once | ORAL | Status: AC
  Administered 2011-05-26: 40 meq via ORAL
  Filled 2011-05-26: qty 2

## 2011-05-26 MED ORDER — ACETAMINOPHEN 325 MG PO TABS
650.0000 mg | ORAL_TABLET | ORAL | Status: DC | PRN
  Administered 2011-05-26: 650 mg via ORAL
  Filled 2011-05-26: qty 2

## 2011-05-26 MED ORDER — BENZOCAINE 10 % MT GEL
Freq: Three times a day (TID) | OROMUCOSAL | Status: DC | PRN
  Administered 2011-05-26: 14:00:00 via OROMUCOSAL
  Filled 2011-05-26: qty 9.4
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
## 2011-05-26 NOTE — Progress Notes (Signed)
Pt. Signed out AMA. Dr. Butler Denmark text paged.

## 2011-05-26 NOTE — Progress Notes (Signed)
Dr. Butler Denmark returned call to make aware of AMA staus on pt. In [phone].

## 2011-05-26 NOTE — Progress Notes (Signed)
Clinical Social Work Department BRIEF PSYCHOSOCIAL ASSESSMENT 05/26/2011  Patient:  Ryan Horn, Ryan Horn     Account Number:  [id number]     Admit date:  05/24/2011  Clinical Social Worker:  Doree Albee  Date/Time:  05/26/2011 03:00 PM  Referred by:  RN  Date Referred:  05/25/2011 Referred for  Substance Abuse   Other Referral:   Interview type:  Patient Other interview type:    PSYCHOSOCIAL DATA Living Status:  FAMILY Admitted from facility:   Level of care:   Primary support name:  lisa clark Primary support relationship to patient:  SIBLING Degree of support available:   adequate    CURRENT CONCERNS Current Concerns  Substance Abuse   Other Concerns:    SOCIAL WORK ASSESSMENT / PLAN CSW met with pt at bedside to discuss substance abuse. Pt stated," I was enjoying some xanax with everyone else, thought I wanted to see what was in the pill crusher. I should have known better". Pt stated that he would not be participating in the xanax parties anymore.    Pt was very adamanet about wanting to go home, and was not interested in waiting for the dentist regarding his tooth abcess. Pt stated he had more important prioroties of working to provide for his child, and his self. Pt stated he would see a dentist on his own, and understood he would have to pay out of pocket. CSW spoke with MD who stated she could give him antibiotics if he would follow up with a dentist, pt agreed.    Pt stated that he understood he had to talk to csw before he could leave, and now that he had said he wasnt going to anymore, he was done speaking iwth the csw. no further csw needs, sign off.   Assessment/plan status:  No Further Intervention Required Other assessment/ plan:   Information/referral to community resources:   substance abuse treatment    PATIENT'S/FAMILY'S RESPONSE TO PLAN OF CARE: pt thanked csw for time and concern. 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 Pt was  being picked up by room mate. Pt agreed to look into substance abuse counseling. no further csw need, sign off.        [name], [name]  [phone] .05/26/2011 1530

## 2011-05-26 NOTE — Progress Notes (Signed)
This Clinical Social Worker was walking by when pt called out to this CSW.  Pt was eating lunch and began speaking about worries that his roommate would "throw him out" and "put my belongings on the street".  Pt shared that his roommate is an older lady who believes pt is "faking" being in the hospital to avoid cutting the grass; pt began to cry.  CSW provided emotional support and utilized active listening.  CSW empathized with the difficulties of current situation.  CSW left message with this pt's service line social worker.    Angelia Mould, MSW, [place] [phone]

## 2011-05-26 NOTE — Progress Notes (Signed)
eLink Physician-Brief Progress Note Patient Name: Ryan Horn DOB: [birth year] MRN: [id number]  Date of Service  05/26/2011   HPI/Events of Note  Hypokalemia   eICU Interventions  Potassium replaced   Intervention Category Intermediate Interventions: Electrolyte abnormality - evaluation and management  Krystina Strieter 05/26/2011, 5:55 AM

## 2011-05-27 ENCOUNTER — Emergency Department (HOSPITAL_COMMUNITY)
Admission: EM | Admit: 2011-05-27 | Discharge: 2011-05-27 | Disposition: A | Discharge status: Home / Self Care | Payer: No Typology Code available for payment source | Attending: Emergency Medicine | Admitting: Emergency Medicine

## 2011-05-27 ENCOUNTER — Emergency Department (HOSPITAL_COMMUNITY): Payer: No Typology Code available for payment source

## 2011-05-27 DIAGNOSIS — M545 Low back pain, unspecified: Secondary | ICD-10-CM | POA: Insufficient documentation

## 2011-05-27 DIAGNOSIS — I2510 Atherosclerotic heart disease of native coronary artery without angina pectoris: Secondary | ICD-10-CM | POA: Insufficient documentation

## 2011-05-27 DIAGNOSIS — Y92410 Unspecified street and highway as the place of occurrence of the external cause: Secondary | ICD-10-CM | POA: Insufficient documentation

## 2011-05-27 DIAGNOSIS — S70211A Abrasion, right hip, initial encounter: Secondary | ICD-10-CM

## 2011-05-27 DIAGNOSIS — R079 Chest pain, unspecified: Secondary | ICD-10-CM | POA: Insufficient documentation

## 2011-05-27 DIAGNOSIS — IMO0002 Reserved for concepts with insufficient information to code with codable children: Secondary | ICD-10-CM | POA: Insufficient documentation

## 2011-05-27 DIAGNOSIS — I10 Essential (primary) hypertension: Secondary | ICD-10-CM | POA: Insufficient documentation

## 2011-05-27 DIAGNOSIS — E669 Obesity, unspecified: Secondary | ICD-10-CM | POA: Insufficient documentation

## 2011-05-27 LAB — POCT I-STAT, CHEM 8
BUN: 8 mg/dL (ref 6–23)
Calcium, Ion: 1.18 mmol/L (ref 1.12–1.32)
Chloride: 104 mEq/L (ref 96–112)
Creatinine, Ser: 1.2 mg/dL (ref 0.50–1.35)
Glucose, Bld: 88 mg/dL (ref 70–99)

## 2011-05-27 LAB — RAPID URINE DRUG SCREEN, HOSP PERFORMED
Amphetamines: NOT DETECTED
Benzodiazepines: NOT DETECTED
Cocaine: POSITIVE — AB
Opiates: NOT DETECTED
Tetrahydrocannabinol: NOT DETECTED

## 2011-05-27 LAB — URINALYSIS, ROUTINE W REFLEX MICROSCOPIC
Glucose, UA: NEGATIVE mg/dL
Hgb urine dipstick: NEGATIVE
Protein, ur: 30 mg/dL — AB
pH: 6.5 (ref 5.0–8.0)

## 2011-05-27 MED ORDER — HYDROMORPHONE HCL PF 1 MG/ML IJ SOLN
INTRAMUSCULAR | Status: AC
  Filled 2011-05-27: qty 1

## 2011-05-27 MED ORDER — IOHEXOL 300 MG/ML  SOLN
100.0000 mL | Freq: Once | INTRAMUSCULAR | Status: AC | PRN
  Administered 2011-05-27: 100 mL via INTRAVENOUS

## 2011-05-27 MED ORDER — HYDROMORPHONE HCL PF 1 MG/ML IJ SOLN
1.0000 mg | Freq: Once | INTRAMUSCULAR | Status: AC
  Administered 2011-05-27: 1 mg via INTRAVENOUS
  Filled 2011-05-27: qty 1

## 2011-05-27 MED ORDER — TETANUS-DIPHTH-ACELL PERTUSSIS 5-2.5-18.5 LF-MCG/0.5 IM SUSP
0.5000 mL | Freq: Once | INTRAMUSCULAR | Status: AC
  Administered 2011-05-27: 0.5 mL via INTRAMUSCULAR
  Filled 2011-05-27: qty 0.5

## 2011-05-27 MED ORDER — HYDROMORPHONE HCL PF 1 MG/ML IJ SOLN
0.5000 mg | Freq: Once | INTRAMUSCULAR | Status: AC
  Administered 2011-05-27: 0.5 mg via INTRAVENOUS

## 2011-05-27 MED ORDER — TRAMADOL HCL 50 MG PO TABS: 50.0000 mg | ORAL_TABLET | Freq: Four times a day (QID) | ORAL | Status: DC | PRN
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
## 2011-05-27 NOTE — ED Notes (Signed)
Patient transported to X-ray 
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
## 2011-05-28 NOTE — ED Provider Notes (Signed)
Medical screening examination/treatment/procedure(s) were conducted as a shared visit with non-physician practitioner(s) and myself.  I personally evaluated the patient during the encounter  The patient's abdomen is benign on exam.  Images obtained without acute pathology.  The patient feels better at time of discharge.  Abdominal exam is benign.  DC home in good condition.  Infection warnings given for right-sided or rash.      Lyanne Co, MD 05/28/11 [phone]

## 2011-05-30 ENCOUNTER — Emergency Department (HOSPITAL_COMMUNITY)
Admission: EM | Admit: 2011-05-30 | Discharge: 2011-05-30 | Disposition: A | Discharge status: Home / Self Care | Payer: No Typology Code available for payment source | Attending: Emergency Medicine | Admitting: Emergency Medicine

## 2011-05-30 ENCOUNTER — Encounter (HOSPITAL_COMMUNITY): Payer: Self-pay | Admitting: *Deleted

## 2011-05-30 DIAGNOSIS — Y92410 Unspecified street and highway as the place of occurrence of the external cause: Secondary | ICD-10-CM | POA: Insufficient documentation

## 2011-05-30 DIAGNOSIS — E669 Obesity, unspecified: Secondary | ICD-10-CM | POA: Insufficient documentation

## 2011-05-30 DIAGNOSIS — M62838 Other muscle spasm: Secondary | ICD-10-CM

## 2011-05-30 DIAGNOSIS — I2510 Atherosclerotic heart disease of native coronary artery without angina pectoris: Secondary | ICD-10-CM | POA: Insufficient documentation

## 2011-05-30 DIAGNOSIS — I10 Essential (primary) hypertension: Secondary | ICD-10-CM | POA: Insufficient documentation

## 2011-05-30 DIAGNOSIS — M25559 Pain in unspecified hip: Secondary | ICD-10-CM | POA: Insufficient documentation

## 2011-05-30 MED ORDER — METHOCARBAMOL 500 MG PO TABS
1000.0000 mg | ORAL_TABLET | Freq: Once | ORAL | Status: AC
  Administered 2011-05-30: 1000 mg via ORAL
  Filled 2011-05-30: qty 2

## 2011-05-30 MED ORDER — TRAMADOL HCL 50 MG PO TABS: 50.0000 mg | ORAL_TABLET | Freq: Four times a day (QID) | ORAL | Status: DC | PRN

## 2011-05-30 MED ORDER — TRAMADOL HCL 50 MG PO TABS
50.0000 mg | ORAL_TABLET | Freq: Once | ORAL | Status: AC
  Administered 2011-05-30: 50 mg via ORAL
  Filled 2011-05-30: qty 1

## 2011-05-30 MED ORDER — IBUPROFEN 600 MG PO TABS: 600.0000 mg | ORAL_TABLET | Freq: Four times a day (QID) | ORAL | Status: AC | PRN

## 2011-05-30 MED ORDER — IBUPROFEN 800 MG PO TABS
800.0000 mg | ORAL_TABLET | Freq: Once | ORAL | Status: AC
  Administered 2011-05-30: 800 mg via ORAL
  Filled 2011-05-30: qty 1

## 2011-05-30 MED ORDER — METHOCARBAMOL 500 MG PO TABS: 500.0000 mg | ORAL_TABLET | Freq: Two times a day (BID) | ORAL | Status: AC
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
## 2011-05-30 NOTE — ED Notes (Addendum)
Pt is going upstairs to see family, ambulatory with mild limp

## 2011-05-30 NOTE — ED Notes (Signed)
Pt given bacitracin for wound care and extra chux for home use
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
## 2011-06-02 ENCOUNTER — Emergency Department (HOSPITAL_COMMUNITY)
Admission: EM | Admit: 2011-06-02 | Discharge: 2011-06-02 | Disposition: A | Discharge status: Home / Self Care | Payer: No Typology Code available for payment source | Attending: Emergency Medicine | Admitting: Emergency Medicine

## 2011-06-02 ENCOUNTER — Emergency Department (HOSPITAL_COMMUNITY): Payer: No Typology Code available for payment source

## 2011-06-02 ENCOUNTER — Encounter (HOSPITAL_COMMUNITY): Payer: Self-pay | Admitting: Emergency Medicine

## 2011-06-02 DIAGNOSIS — W268XXA Contact with other sharp object(s), not elsewhere classified, initial encounter: Secondary | ICD-10-CM | POA: Insufficient documentation

## 2011-06-02 DIAGNOSIS — M533 Sacrococcygeal disorders, not elsewhere classified: Secondary | ICD-10-CM | POA: Insufficient documentation

## 2011-06-02 DIAGNOSIS — Z79899 Other long term (current) drug therapy: Secondary | ICD-10-CM | POA: Insufficient documentation

## 2011-06-02 DIAGNOSIS — I2510 Atherosclerotic heart disease of native coronary artery without angina pectoris: Secondary | ICD-10-CM | POA: Insufficient documentation

## 2011-06-02 DIAGNOSIS — F17200 Nicotine dependence, unspecified, uncomplicated: Secondary | ICD-10-CM | POA: Insufficient documentation

## 2011-06-02 DIAGNOSIS — R262 Difficulty in walking, not elsewhere classified: Secondary | ICD-10-CM | POA: Insufficient documentation

## 2011-06-02 DIAGNOSIS — IMO0002 Reserved for concepts with insufficient information to code with codable children: Secondary | ICD-10-CM | POA: Insufficient documentation

## 2011-06-02 DIAGNOSIS — I10 Essential (primary) hypertension: Secondary | ICD-10-CM | POA: Insufficient documentation

## 2011-06-02 DIAGNOSIS — T148XXA Other injury of unspecified body region, initial encounter: Secondary | ICD-10-CM

## 2011-06-02 MED ORDER — CEPHALEXIN 500 MG PO CAPS: 500.0000 mg | ORAL_CAPSULE | Freq: Three times a day (TID) | ORAL | Status: AC

## 2011-06-02 MED ORDER — LIDOCAINE HCL 4 % EX SOLN
CUTANEOUS | Status: AC
  Administered 2011-06-02: 50 mL via TOPICAL
  Filled 2011-06-02: qty 50

## 2011-06-02 MED ORDER — OXYCODONE-ACETAMINOPHEN 5-325 MG PO TABS
1.0000 | ORAL_TABLET | Freq: Once | ORAL | Status: AC
  Administered 2011-06-02: 1 via ORAL
  Filled 2011-06-02: qty 1

## 2011-06-02 MED ORDER — LIDOCAINE HCL 4 % EX SOLN
Freq: Once | CUTANEOUS | Status: AC
  Administered 2011-06-02: 50 mL via TOPICAL

## 2011-06-02 NOTE — Discharge Instructions (Signed)
Please read and follow all provided instructions.  Your diagnoses today include:  1. Abrasion     Tests performed today include:  Vital signs. See below for your results today.   X-ray that does not show any definite glass or debris, no broken bones  Medications prescribed:   Keflex - antibiotic that kills skin bacteria  You have been prescribed an antibiotic medicine: take the entire course of medicine even if you are feeling better. Stopping early can cause the antibiotic not to work.  Take any prescribed medications only as directed.   Home care instructions:  Follow any educational materials contained in this packet. Keep affected area above the level of your heart when possible. Wash area gently twice a day with warm soapy water. Do not apply alcohol or hydrogen peroxide. Cover the area if it draining or weeping.   Continue home pain medications.   Follow-up instructions: Return to the Emergency Department in 48 hours for a recheck if your symptoms are not significantly improved.   Please follow-up with your primary care provider in the next 1 week for further evaluation of your symptoms. If you do not have a primary care doctor -- see below for referral information.   Return instructions:  Return to the Emergency Department if you have:  Fever  Worsening symptoms  Worsening pain  Worsening swelling  Redness of the skin that moves away from the affected area, especially if it streaks away from the affected area   Any other emergent concerns  Your vital signs today were: BP 158/97  Pulse 75  Temp(Src) 98.8 F (37.1 C) (Oral)  Resp 18  SpO2 99% If your blood pressure (BP) was elevated above 135/85 this visit, please have this repeated by your doctor within one month. -------------- No Primary Care Doctor Call [facility]  [phone] Other agencies that provide inexpensive medical care    [facility]  [phone]    [facility]   [phone]    [facility]  [phone]    [facility]  [phone]    [facility]  [phone]    [facility]  [phone] -------------- RESOURCE GUIDE:  Dental Problems  Patients with Medicaid: [facility] [phone] [address]                                            [phone] [address] Phone:  [phone]                                                   Phone:  [phone]  If unable to pay or uninsured, contact:  [facility] or [facility]. to become qualified for the adult dental clinic.  Chronic Pain Problems Contact [facility]  [phone] Patients need to be referred by their primary care doctor.  Insufficient Money for Medicine Contact [employer]:  call "211" or [facility] [phone].  Psychological Services [facility]  [phone] [facility]  [phone] [facility]   [phone] (emergency services [phone])  Substance  Abuse Resources [facility]  [phone] [facility] [phone] [facility] [phone] Floydene Flock [phone] Residential & Outpatient Substance Abuse Program  [phone]  Abuse/Neglect [facility] Child Abuse Hotline (848)176-1496 [facility] Child Abuse Hotline 8051075655 (After Hours)  Emergency Shelter [facility] ([phone]  [facility] [phone] [facility] ([phone]  [facility] Resources  [facility]     [employer]                          [facility] Dept. [address]                        [address]      [address]  Blondell Reveal Phone:  [phone]                                   Phone:  [phone]                  Phone:  [phone]  [facility] Phone:  [phone]  [facility] Child Abuse Hotline (480)121-7306 [phone] (After Hours)
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
## 2011-08-31 ENCOUNTER — Encounter (HOSPITAL_COMMUNITY): Payer: Self-pay | Admitting: *Deleted

## 2011-08-31 ENCOUNTER — Emergency Department (HOSPITAL_COMMUNITY)
Admission: EM | Admit: 2011-08-31 | Discharge: 2011-09-01 | Disposition: A | Discharge status: Home / Self Care | Payer: Self-pay | Attending: Emergency Medicine | Admitting: Emergency Medicine

## 2011-08-31 DIAGNOSIS — L02219 Cutaneous abscess of trunk, unspecified: Secondary | ICD-10-CM | POA: Insufficient documentation

## 2011-08-31 DIAGNOSIS — L03319 Cellulitis of trunk, unspecified: Secondary | ICD-10-CM | POA: Insufficient documentation

## 2011-08-31 DIAGNOSIS — I10 Essential (primary) hypertension: Secondary | ICD-10-CM | POA: Insufficient documentation

## 2011-08-31 DIAGNOSIS — E669 Obesity, unspecified: Secondary | ICD-10-CM | POA: Insufficient documentation

## 2011-08-31 DIAGNOSIS — L02211 Cutaneous abscess of abdominal wall: Secondary | ICD-10-CM

## 2011-08-31 DIAGNOSIS — F17200 Nicotine dependence, unspecified, uncomplicated: Secondary | ICD-10-CM | POA: Insufficient documentation

## 2011-08-31 DIAGNOSIS — F209 Schizophrenia, unspecified: Secondary | ICD-10-CM | POA: Insufficient documentation

## 2011-08-31 DIAGNOSIS — F319 Bipolar disorder, unspecified: Secondary | ICD-10-CM | POA: Insufficient documentation

## 2011-08-31 DIAGNOSIS — G4730 Sleep apnea, unspecified: Secondary | ICD-10-CM | POA: Insufficient documentation

## 2011-08-31 HISTORY — DX: Bipolar disorder, unspecified: F31.9

## 2011-08-31 HISTORY — DX: Major depressive disorder, single episode, unspecified: F32.9

## 2011-08-31 HISTORY — DX: Schizophrenia, unspecified: F20.9

## 2011-08-31 HISTORY — DX: Depression, unspecified: F32.A

## 2011-08-31 HISTORY — DX: Sleep apnea, unspecified: G47.30

## 2011-08-31 MED ORDER — SULFAMETHOXAZOLE-TRIMETHOPRIM 800-160 MG PO TABS: 1.0000 | ORAL_TABLET | Freq: Two times a day (BID) | ORAL | Status: DC

## 2011-08-31 MED ORDER — IBUPROFEN 400 MG PO TABS
800.0000 mg | ORAL_TABLET | Freq: Once | ORAL | Status: AC
  Administered 2011-09-01: 800 mg via ORAL
  Filled 2011-08-31: qty 2

## 2011-08-31 NOTE — ED Provider Notes (Signed)
History     CSN: [id number]  Arrival date & time 08/31/11  2054   First MD Initiated Contact with Patient 08/31/11 2336      Chief Complaint  Patient presents with  . Insect Bite    spider bite   HPI  History provided by the patient. Patient is a 36 year old male with history of hypertension, schizophrenia, bipolar disorder presents with complaints of spider bite to abdomen. Patient states that he had a spider bite last Monday 3 days ago and developed an area of pain and swelling to his abdomen. Patient states there was a small scratch or scabbed that he picked off. Symptoms she became much worse yesterday and today. There is described a small and tender to the touch. He denies any bleeding or drainage from the area. Patient denies any fevers, chills or sweats. Patient denies having similar symptoms previously. Patient has not used any treatment for symptoms. He denies any other aggravating or alleviating factors.      Past Medical History  Diagnosis Date  . Obesity   . Hypertension   . Shortness of breath   . Sleep apnea   . Schizophrenia   . Bipolar 1 disorder   . Depression     Past Surgical History  Procedure Date  . Cholecystectomy     Family History  Problem Relation Age of Onset  . Anesthesia problems Mother     History  Substance Use Topics  . Smoking status: Current Everyday Smoker -- 0.2 packs/day for 22 years    Types: Cigarettes  . Smokeless tobacco: Not on file  . Alcohol Use: Yes     OCCASIONAL      Review of Systems  Constitutional: Negative for fever and chills.  Respiratory: Negative for shortness of breath.   Cardiovascular: Negative for chest pain.  Gastrointestinal: Positive for abdominal pain.  Skin: Negative for rash.       Spider bite    Allergies  Hydrocodone; Tramadol; and Tylenol  Home Medications   Current Outpatient Rx  Name Route Sig Dispense Refill  . DIVALPROEX SODIUM 500 MG PO TBEC Oral Take 500 mg by mouth 2 (two)  times daily.    Marland Kitchen HYDROXYZINE PAMOATE 25 MG PO CAPS Oral Take 25 mg by mouth 3 (three) times daily as needed. For anxiety    . IBUPROFEN 200 MG PO TABS Oral Take 800 mg by mouth every 6 (six) hours as needed. For pain    . QUETIAPINE FUMARATE 50 MG PO TABS Oral Take 50 mg by mouth at bedtime.    . SERTRALINE HCL 50 MG PO TABS Oral Take 50 mg by mouth daily.      BP 171/100  Pulse 66  Temp 99.2 F (37.3 C) (Oral)  Resp 16  SpO2 96%  Physical Exam  Nursing note and vitals reviewed. Constitutional: He is oriented to person, place, and time. He appears well-developed and well-nourished. No distress.  HENT:  Head: Normocephalic.  Cardiovascular: Normal rate and regular rhythm.   Pulmonary/Chest: Effort normal and breath sounds normal.  Abdominal: Soft. There is no rebound and no guarding.  Neurological: He is alert and oriented to person, place, and time.  Skin: Skin is warm.       2 cm area of erythema and induration to left upper abdomen. Tenderness to palpation. No significant fluctuance. No bleeding or drainage.  Psychiatric: He has a normal mood and affect. His behavior is normal.    ED Course  Procedures  1. Abscess of abdominal wall       MDM  11:40PM patient seen and evaluated. Small 1-2 cm area of in duration 2 skin of left upper abdomen. No significant fluctuance. At this time do not feel area would benefit from I&D. Will give prescription for antibiotics and advised for warm compresses to area.        Angus Seller, [place] 09/01/11 [phone]

## 2011-08-31 NOTE — ED Notes (Signed)
Pt states he was bitten by a spider on Monday.  He "cut a black top off" the bite.  He states pain to L upper quadrant.

## 2011-09-01 NOTE — ED Provider Notes (Signed)
Medical screening examination/treatment/procedure(s) were performed by non-physician practitioner and as supervising physician I was immediately available for consultation/collaboration.    Pressley Barsky D Brytni Dray, MD 09/01/11 0425 

## 2011-09-01 NOTE — ED Notes (Signed)
Pt states understanding of discharge instructions 

## 2011-09-03 ENCOUNTER — Emergency Department (HOSPITAL_COMMUNITY)
Admission: EM | Admit: 2011-09-03 | Discharge: 2011-09-04 | Disposition: A | Discharge status: Home / Self Care | Payer: Self-pay | Attending: Emergency Medicine | Admitting: Emergency Medicine

## 2011-09-03 ENCOUNTER — Encounter (HOSPITAL_COMMUNITY): Payer: Self-pay | Admitting: *Deleted

## 2011-09-03 DIAGNOSIS — F319 Bipolar disorder, unspecified: Secondary | ICD-10-CM | POA: Insufficient documentation

## 2011-09-03 DIAGNOSIS — F17200 Nicotine dependence, unspecified, uncomplicated: Secondary | ICD-10-CM | POA: Insufficient documentation

## 2011-09-03 DIAGNOSIS — L03319 Cellulitis of trunk, unspecified: Secondary | ICD-10-CM | POA: Insufficient documentation

## 2011-09-03 DIAGNOSIS — I10 Essential (primary) hypertension: Secondary | ICD-10-CM | POA: Insufficient documentation

## 2011-09-03 DIAGNOSIS — L02219 Cutaneous abscess of trunk, unspecified: Secondary | ICD-10-CM | POA: Insufficient documentation

## 2011-09-03 DIAGNOSIS — L988 Other specified disorders of the skin and subcutaneous tissue: Secondary | ICD-10-CM | POA: Insufficient documentation

## 2011-09-03 DIAGNOSIS — L0291 Cutaneous abscess, unspecified: Secondary | ICD-10-CM

## 2011-09-03 DIAGNOSIS — F209 Schizophrenia, unspecified: Secondary | ICD-10-CM | POA: Insufficient documentation

## 2011-09-03 MED ORDER — CEPHALEXIN 500 MG PO CAPS: 500.0000 mg | ORAL_CAPSULE | Freq: Four times a day (QID) | ORAL | Status: AC

## 2011-09-03 MED ORDER — IBUPROFEN 200 MG PO TABS
400.0000 mg | ORAL_TABLET | Freq: Once | ORAL | Status: AC
  Administered 2011-09-03: 400 mg via ORAL
  Filled 2011-09-03: qty 2

## 2011-09-03 NOTE — ED Notes (Signed)
Pt stated he homeless and has nobody.

## 2011-09-03 NOTE — ED Notes (Signed)
Pt seen 08/31/11 when insect bite first occurred. Pt states that he has been taking antibiotics and that he has noticed a black center to the abscess. Pt states abdominal pain.

## 2011-09-03 NOTE — ED Notes (Signed)
Stated insect bite left lateral abdominal area  four days ago, site has scab with some yellowish filled fluid around the edges. Pt stated came to Ed when it happened and they told he it was infected hair, he told them it was a spider bite.  State having pain on the site now and it hurt worst now, 9/10 pain  On scale of 1-10. Site is warm and tender to touch.

## 2011-09-03 NOTE — ED Provider Notes (Signed)
History     CSN: [id number]  Arrival date & time 09/03/11  2123   First MD Initiated Contact with Patient 09/03/11 2149      Chief Complaint  Patient presents with  . Insect Bite  . Abscess    (Consider location/radiation/quality/duration/timing/severity/associated sxs/prior treatment) HPI Comments: Patient reports that he was bitten by a spider on 08/31/11 on his abdomen.  He actually saw and felt the spider bite him.  He thinks that the spider was a brown recluse.  He was seen in the ED after the bite and was placed on Bactrim DS.  He reports that the area is becoming larger and he has noticed a small amount of black skin in the center.  He denies muscle cramps, headaches, fever, nausea and vomiting.  He has not put anything on the area.    The history is provided by the patient.    Past Medical History  Diagnosis Date  . Obesity   . Hypertension   . Shortness of breath   . Sleep apnea   . Schizophrenia   . Bipolar 1 disorder   . Depression     Past Surgical History  Procedure Date  . Cholecystectomy     Family History  Problem Relation Age of Onset  . Anesthesia problems Mother     History  Substance Use Topics  . Smoking status: Current Everyday Smoker -- 0.2 packs/day for 22 years    Types: Cigarettes  . Smokeless tobacco: Not on file  . Alcohol Use: Yes     OCCASIONAL      Review of Systems  Constitutional: Negative for fever and chills.  Gastrointestinal: Negative for nausea and vomiting.  Musculoskeletal: Negative for myalgias.  Skin:       abscesss  Neurological: Negative for headaches.  Psychiatric/Behavioral: Negative for confusion.    Allergies  Hydrocodone; Tramadol; and Tylenol  Home Medications   Current Outpatient Rx  Name Route Sig Dispense Refill  . DIVALPROEX SODIUM 500 MG PO TBEC Oral Take 500 mg by mouth 2 (two) times daily.    Marland Kitchen HYDROXYZINE PAMOATE 25 MG PO CAPS Oral Take 25 mg by mouth 3 (three) times daily as needed. For  anxiety    . IBUPROFEN 200 MG PO TABS Oral Take 800 mg by mouth every 6 (six) hours as needed. For pain    . QUETIAPINE FUMARATE 50 MG PO TABS Oral Take 50 mg by mouth at bedtime.    . SERTRALINE HCL 50 MG PO TABS Oral Take 50 mg by mouth daily.    . SULFAMETHOXAZOLE-TRIMETHOPRIM 800-160 MG PO TABS Oral Take 1 tablet by mouth every 12 (twelve) hours. 14 tablet 0    BP 165/94  Pulse 85  Temp 98.5 F (36.9 C) (Oral)  Resp 18  SpO2 100%  Physical Exam  Nursing note and vitals reviewed. Constitutional: He appears well-developed and well-nourished. No distress.  HENT:  Head: Normocephalic and atraumatic.  Mouth/Throat: Oropharynx is clear and moist.  Cardiovascular: Normal rate, regular rhythm and normal heart sounds.   Pulmonary/Chest: Effort normal and breath sounds normal.  Abdominal: Soft. Bowel sounds are normal.  Musculoskeletal: Normal range of motion.  Neurological: He is alert.  Skin: Skin is warm. He is not diaphoretic.     Psychiatric: He has a normal mood and affect.    ED Course  Procedures (including critical care time)  Labs Reviewed - No data to display No results found.   No diagnosis found.  INCISION AND  DRAINAGE Performed by: Anne Shutter, Herbert Seta Consent: Verbal consent obtained. Risks and benefits: risks, benefits and alternatives were discussed Type: abscess  Body area: abdomen  Anesthesia: local infiltration  Local anesthetic: lidocaine 2% with epinephrine  Anesthetic total: 5 ml  Complexity: complex Blunt dissection to break up loculations  Drainage: purulent  Drainage amount: moderate  Packing material: 1/4 in iodoform gauze  Patient tolerance: Patient tolerated the procedure well with no immediate complications.  Small area of necrosis debrided  MDM  Patient with an abscess of his abdomen.  Abscess incised and drained with good results.  Patient afebrile.  No systemic symptoms.  Patient discharged home and instructed to follow up  with PCP in a couple of days.        Pascal Lux [place], PA-C 09/05/11 1511

## 2011-09-07 NOTE — ED Provider Notes (Signed)
Medical screening examination/treatment/procedure(s) were performed by non-physician practitioner and as supervising physician I was immediately available for consultation/collaboration.   Loren Racer, MD 09/07/11 2245

## 2011-11-03 ENCOUNTER — Encounter (HOSPITAL_COMMUNITY): Payer: Self-pay | Admitting: Emergency Medicine

## 2011-11-03 ENCOUNTER — Emergency Department (HOSPITAL_COMMUNITY)
Admission: EM | Admit: 2011-11-03 | Discharge: 2011-11-03 | Disposition: A | Discharge status: Home / Self Care | Payer: Self-pay | Attending: Emergency Medicine | Admitting: Emergency Medicine

## 2011-11-03 DIAGNOSIS — I10 Essential (primary) hypertension: Secondary | ICD-10-CM | POA: Insufficient documentation

## 2011-11-03 DIAGNOSIS — E669 Obesity, unspecified: Secondary | ICD-10-CM | POA: Insufficient documentation

## 2011-11-03 DIAGNOSIS — F209 Schizophrenia, unspecified: Secondary | ICD-10-CM | POA: Insufficient documentation

## 2011-11-03 DIAGNOSIS — L0201 Cutaneous abscess of face: Secondary | ICD-10-CM | POA: Insufficient documentation

## 2011-11-03 DIAGNOSIS — Z888 Allergy status to other drugs, medicaments and biological substances status: Secondary | ICD-10-CM | POA: Insufficient documentation

## 2011-11-03 DIAGNOSIS — F319 Bipolar disorder, unspecified: Secondary | ICD-10-CM | POA: Insufficient documentation

## 2011-11-03 DIAGNOSIS — F17200 Nicotine dependence, unspecified, uncomplicated: Secondary | ICD-10-CM | POA: Insufficient documentation

## 2011-11-03 DIAGNOSIS — L03211 Cellulitis of face: Secondary | ICD-10-CM | POA: Insufficient documentation

## 2011-11-03 MED ORDER — SULFAMETHOXAZOLE-TMP DS 800-160 MG PO TABS
1.0000 | ORAL_TABLET | Freq: Once | ORAL | Status: AC
  Administered 2011-11-03: 1 via ORAL
  Filled 2011-11-03: qty 1

## 2011-11-03 MED ORDER — BUPIVACAINE HCL (PF) 0.5 % IJ SOLN: 10.0000 mL | Freq: Once | INTRAMUSCULAR | Status: DC

## 2011-11-03 MED ORDER — BUPIVACAINE HCL 0.5 % IJ SOLN: 15.0000 mL | Freq: Once | INTRAMUSCULAR | Status: DC

## 2011-11-03 MED ORDER — SULFAMETHOXAZOLE-TRIMETHOPRIM 800-160 MG PO TABS: 1.0000 | ORAL_TABLET | Freq: Two times a day (BID) | ORAL | Status: DC

## 2011-11-03 MED ORDER — BUPIVACAINE HCL 0.5 % IJ SOLN: 10.0000 mL | Freq: Once | INTRAMUSCULAR | Status: DC

## 2011-11-03 MED ORDER — OXYCODONE-ACETAMINOPHEN 5-325 MG PO TABS
2.0000 | ORAL_TABLET | Freq: Once | ORAL | Status: AC
  Administered 2011-11-03: 2 via ORAL
  Filled 2011-11-03: qty 2

## 2011-11-03 MED ORDER — IBUPROFEN 400 MG PO TABS
400.0000 mg | ORAL_TABLET | Freq: Once | ORAL | Status: AC
  Administered 2011-11-03: 400 mg via ORAL
  Filled 2011-11-03: qty 1

## 2011-11-03 NOTE — ED Notes (Signed)
MD at bedside. 

## 2011-11-03 NOTE — ED Notes (Signed)
Onset two days ago spider bite left side of face abscess pain 10/10 dull light pink hard placed neosporin on this am. Intermittent scant yellow drainage.

## 2011-11-03 NOTE — ED Provider Notes (Signed)
History  Scribed for Raeford Razor, MD, the patient was seen in room TR05C/TR05C. This chart was scribed by Candelaria Stagers. The patient's care started at 11:39 AM   CSN: [id number]  Arrival date & time 11/03/11  1052   First MD Initiated Contact with Patient 11/03/11 1138      Chief Complaint  Patient presents with  . Insect Bite    The history is provided by the patient. No language interpreter was used.   Ryan Horn is a 36 y.o. male who presents to the Emergency Department complaining of a spider bite to the left cheek that occurred two days ago.  He did see the spider after the bite.  Pt states that he has been experiencing chills at night.  He has had some drainage from the bite.  Nothing seems to make the sx better or worse.   Past Medical History  Diagnosis Date  . Obesity   . Hypertension   . Shortness of breath   . Sleep apnea   . Schizophrenia   . Bipolar 1 disorder   . Depression     Past Surgical History  Procedure Date  . Cholecystectomy     Family History  Problem Relation Age of Onset  . Anesthesia problems Mother     History  Substance Use Topics  . Smoking status: Current Every Day Smoker -- 0.2 packs/day for 22 years    Types: Cigarettes  . Smokeless tobacco: Not on file  . Alcohol Use: Yes     OCCASIONAL      Review of Systems  Constitutional: Positive for chills.  Skin: Positive for wound (insect bite to the left cheek).  All other systems reviewed and are negative.    Allergies  Hydrocodone; Tramadol; and Tylenol  Home Medications   Current Outpatient Rx  Name Route Sig Dispense Refill  . DIVALPROEX SODIUM 500 MG PO TBEC Oral Take 500 mg by mouth 2 (two) times daily.    Marland Kitchen HYDROXYZINE PAMOATE 25 MG PO CAPS Oral Take 25 mg by mouth 2 (two) times daily. For anxiety    . QUETIAPINE FUMARATE 50 MG PO TABS Oral Take 50 mg by mouth at bedtime.    . SERTRALINE HCL 50 MG PO TABS Oral Take 50 mg by mouth daily.      BP 182/103   Pulse 84  Temp 97.9 F (36.6 C) (Oral)  Resp 18  SpO2 98%  Physical Exam  Nursing note and vitals reviewed. Constitutional: He is oriented to person, place, and time. He appears well-developed and well-nourished. No distress.  HENT:  Head: Normocephalic and atraumatic.       1 cm fluctuant lesion left side of face.  Purulent  drainage.  No significant cellulitis.    Eyes: EOM are normal. Pupils are equal, round, and reactive to light.  Neck: Neck supple. No tracheal deviation present.  Pulmonary/Chest: Effort normal. No respiratory distress.  Musculoskeletal: Normal range of motion. He exhibits no edema.  Neurological: He is alert and oriented to person, place, and time. No sensory deficit.  Skin: Skin is warm and dry.  Psychiatric: He has a normal mood and affect. His behavior is normal.    ED Course  Procedures DIAGNOSTIC STUDIES: Oxygen Saturation is 98% on room air, normal by my interpretation.    INCISION AND DRAINAGE Performed by: Raeford Razor Consent: Verbal consent obtained. Risks and benefits: risks, benefits and alternatives were discussed Type: abscess  Body area: L face  Anesthesia: local  infiltration  Local anesthetic: 0.5% bupivicaine   Anesthetic total: 2  ml  Complexity: complex Blunt dissection to break up loculations  Drainage: purulent  Drainage amount: moderate  Patient tolerance: Patient tolerated the procedure well with no immediate complications.    COORDINATION OF CARE:  11:56 Incision and Drainage of abscess on left side of face.  Performed by Raeford Razor, MD.    Labs Reviewed - No data to display No results found.   1. Facial abscess       MDM  36yM with facial abscess. I&D'd. No surrounding cellulitis but since on face will give short course of abx. Continued wound care and return precautions discussed.  I personally preformed the services scribed in my presence. The recorded information has been reviewed and  considered. Raeford Razor, MD.          Raeford Razor, MD 11/03/11 [phone]

## 2011-12-29 ENCOUNTER — Emergency Department (HOSPITAL_COMMUNITY)
Admission: EM | Admit: 2011-12-29 | Discharge: 2011-12-29 | Disposition: A | Discharge status: Home / Self Care | Payer: Self-pay | Attending: Emergency Medicine | Admitting: Emergency Medicine

## 2011-12-29 ENCOUNTER — Encounter (HOSPITAL_COMMUNITY): Payer: Self-pay | Admitting: Emergency Medicine

## 2011-12-29 ENCOUNTER — Emergency Department (HOSPITAL_COMMUNITY): Payer: Self-pay

## 2011-12-29 DIAGNOSIS — J3489 Other specified disorders of nose and nasal sinuses: Secondary | ICD-10-CM | POA: Insufficient documentation

## 2011-12-29 DIAGNOSIS — G4730 Sleep apnea, unspecified: Secondary | ICD-10-CM | POA: Insufficient documentation

## 2011-12-29 DIAGNOSIS — I10 Essential (primary) hypertension: Secondary | ICD-10-CM | POA: Insufficient documentation

## 2011-12-29 DIAGNOSIS — Z79899 Other long term (current) drug therapy: Secondary | ICD-10-CM | POA: Insufficient documentation

## 2011-12-29 DIAGNOSIS — F319 Bipolar disorder, unspecified: Secondary | ICD-10-CM | POA: Insufficient documentation

## 2011-12-29 DIAGNOSIS — Z5900 Homelessness unspecified: Secondary | ICD-10-CM | POA: Insufficient documentation

## 2011-12-29 DIAGNOSIS — F329 Major depressive disorder, single episode, unspecified: Secondary | ICD-10-CM | POA: Insufficient documentation

## 2011-12-29 DIAGNOSIS — J40 Bronchitis, not specified as acute or chronic: Secondary | ICD-10-CM

## 2011-12-29 DIAGNOSIS — J069 Acute upper respiratory infection, unspecified: Secondary | ICD-10-CM

## 2011-12-29 DIAGNOSIS — F209 Schizophrenia, unspecified: Secondary | ICD-10-CM | POA: Insufficient documentation

## 2011-12-29 DIAGNOSIS — F3289 Other specified depressive episodes: Secondary | ICD-10-CM | POA: Insufficient documentation

## 2011-12-29 DIAGNOSIS — E669 Obesity, unspecified: Secondary | ICD-10-CM | POA: Insufficient documentation

## 2011-12-29 DIAGNOSIS — F17200 Nicotine dependence, unspecified, uncomplicated: Secondary | ICD-10-CM | POA: Insufficient documentation

## 2011-12-29 DIAGNOSIS — R0982 Postnasal drip: Secondary | ICD-10-CM | POA: Insufficient documentation

## 2011-12-29 MED ORDER — GUAIFENESIN-CODEINE 100-10 MG/5ML PO SYRP: 5.0000 mL | ORAL_SOLUTION | Freq: Three times a day (TID) | ORAL | Status: DC | PRN

## 2011-12-29 MED ORDER — GUAIFENESIN-CODEINE 100-10 MG/5ML PO SOLN
10.0000 mL | ORAL | Status: DC | PRN
  Administered 2011-12-29: 10 mL via ORAL
  Filled 2011-12-29 (×2): qty 5

## 2011-12-29 MED ORDER — ALBUTEROL SULFATE HFA 108 (90 BASE) MCG/ACT IN AERS
1.0000 | INHALATION_SPRAY | Freq: Four times a day (QID) | RESPIRATORY_TRACT | Status: DC | PRN
  Administered 2011-12-29: 2 via RESPIRATORY_TRACT
  Filled 2011-12-29: qty 6.7

## 2011-12-29 NOTE — ED Provider Notes (Signed)
History     CSN: [id number]  Arrival date & time 12/29/11  1013   First MD Initiated Contact with Patient 12/29/11 1101      Chief Complaint  Patient presents with  . URI  . Cough    (Consider location/radiation/quality/duration/timing/severity/associated sxs/prior treatment) HPI Comments: Pt is at homeless shelter, several others with coughing and cold symptoms, reports nasal congestion, no fever or chills, coughing worse at night and last night coughed for 2 hours straight.  Pt smokes.  No prior h/o asthma, pneumonia.  Denies night sweats, weight loss.  No sig CP.  Has minimal sinus pressure, some post nasal drip.  No N/V/D.    Patient is a 36 y.o. male presenting with URI and cough. The history is provided by the patient.  URI The primary symptoms include cough. Primary symptoms do not include fever, sore throat, abdominal pain, nausea or vomiting.  Symptoms associated with the illness include congestion. The illness is not associated with chills.  Cough Pertinent negatives include no chest pain, no chills, no sore throat and no shortness of breath.    Past Medical History  Diagnosis Date  . Obesity   . Hypertension   . Shortness of breath   . Sleep apnea   . Schizophrenia   . Bipolar 1 disorder   . Depression     Past Surgical History  Procedure Date  . Cholecystectomy     Family History  Problem Relation Age of Onset  . Anesthesia problems Mother     History  Substance Use Topics  . Smoking status: Current Every Day Smoker -- 0.2 packs/day for 22 years    Types: Cigarettes  . Smokeless tobacco: Not on file  . Alcohol Use: Yes     Comment: OCCASIONAL      Review of Systems  Constitutional: Negative for fever, chills, diaphoresis, appetite change and unexpected weight change.  HENT: Positive for congestion and postnasal drip. Negative for sore throat, trouble swallowing and voice change.   Respiratory: Positive for cough. Negative for shortness of  breath.   Cardiovascular: Negative for chest pain.  Gastrointestinal: Negative for nausea, vomiting, abdominal pain and diarrhea.  All other systems reviewed and are negative.    Allergies  Hydrocodone; Tramadol; and Tylenol  Home Medications   Current Outpatient Rx  Name  Route  Sig  Dispense  Refill  . ARIPIPRAZOLE 9.75 MG/1.3ML IM SOLN   Intramuscular   Inject 9.75 mg into the muscle every 30 (thirty) days.         Marland Kitchen DIVALPROEX SODIUM 500 MG PO TBEC   Oral   Take 500 mg by mouth 2 (two) times daily.         Marland Kitchen HYDROXYZINE PAMOATE 25 MG PO CAPS   Oral   Take 25 mg by mouth 2 (two) times daily. For anxiety         . SERTRALINE HCL 50 MG PO TABS   Oral   Take 50 mg by mouth 2 (two) times daily.            BP 181/95  Pulse 86  Temp 98.9 F (37.2 C) (Oral)  Resp 22  SpO2 94%  Physical Exam  Nursing note and vitals reviewed. Constitutional: He appears well-developed and well-nourished.  HENT:  Head: Normocephalic and atraumatic.  Nose: Mucosal edema and rhinorrhea present. Right sinus exhibits no frontal sinus tenderness. Left sinus exhibits no frontal sinus tenderness.  Mouth/Throat: Uvula is midline and oropharynx is clear and moist.  Eyes: Pupils are equal, round, and reactive to light. No scleral icterus.  Neck: Normal range of motion.  Cardiovascular: Normal rate and regular rhythm.   Pulmonary/Chest: Effort normal. No respiratory distress. He has no wheezes. He has no rales.       Paroxysmal dry coughing  Abdominal: He exhibits no distension. There is no tenderness.  Lymphadenopathy:    He has no cervical adenopathy.  Skin: Skin is warm.    ED Course  Procedures (including critical care time)  Labs Reviewed - No data to display Dg Chest 2 View  12/29/2011  *RADIOLOGY REPORT*  Clinical Data: 36 year old male with chest pain, shortness of breath and cough.  CHEST - 2 VIEW  Comparison: 05/25/2011 and prior chest radiographs dating back to  06/20/2010  Findings: The cardiomediastinal silhouette is unremarkable. Mild peribronchial thickening is again noted. There is no evidence of focal airspace disease, pulmonary edema, suspicious pulmonary nodule/mass, pleural effusion, or pneumothorax. No acute bony abnormalities are identified.  IMPRESSION: No evidence of acute cardiopulmonary disease.  Mild chronic peribronchial thickening.   Original Report Authenticated By: Harmon Pier, M.D.    I reviewed the above CXR myself and reviewed radiologist results  1. URI (upper respiratory infection)   2. Bronchitis      ra sat is 94% and i interpret to be adequate  MDM  Pt with URI symptoms, some early bronchitis symptoms.  Pt is mostly complaining of cough which is likely due to URI.  Cough medication provided.  Pt is at shelter, no insurance.  Given inhaler and cough medication.  No abx needed.  Pt encouraged to stop smoking.          Gavin Pound. Clary Boulais, MD 12/29/11 1220

## 2011-12-29 NOTE — ED Notes (Signed)
Pt c/o URI with productive cough with green sputum; pt sts pain with cough; pt sts head congestion x 3 days

## 2011-12-29 NOTE — ED Notes (Signed)
Patient transported to X-ray 

## 2011-12-29 NOTE — ED Notes (Signed)
Patient returned from X-ray 

## 2011-12-29 NOTE — Discharge Instructions (Signed)
Bronchitis  Bronchitis is a problem of the air tubes leading to your lungs. This problem makes it hard for air to get in and out of the lungs. You may cough a lot because your air tubes are narrow. Going without care can cause lasting (chronic) bronchitis.  HOME CARE    Drink enough fluids to keep your pee (urine) clear or pale yellow.   Use a cool mist humidifier.   Quit smoking if you smoke. If you keep smoking, the bronchitis might not get better.   Only take medicine as told by your doctor.  GET HELP RIGHT AWAY IF:    Coughing keeps you awake.   You start to wheeze.   You become more sick or weak.   You have a hard time breathing or get short of breath.   You cough up blood.   Coughing lasts more than 2 weeks.   You have a fever.   Your baby is older than 3 months with a rectal temperature of 102 F (38.9 C) or higher.   Your baby is 3 months old or younger with a rectal temperature of 100.4 F (38 C) or higher.  MAKE SURE YOU:   Understand these instructions.   Will watch your condition.   Will get help right away if you are not doing well or get worse.  Document Released: 07/06/2007 Document Revised: 04/11/2011 Document Reviewed: 12/19/2008  [employer] Patient Information 2013 [employer]

## 2012-01-07 ENCOUNTER — Emergency Department (HOSPITAL_COMMUNITY): Payer: Self-pay

## 2012-01-07 ENCOUNTER — Emergency Department (HOSPITAL_COMMUNITY)
Admission: EM | Admit: 2012-01-07 | Discharge: 2012-01-07 | Disposition: A | Discharge status: Home / Self Care | Payer: Self-pay | Attending: Emergency Medicine | Admitting: Emergency Medicine

## 2012-01-07 ENCOUNTER — Encounter (HOSPITAL_COMMUNITY): Payer: Self-pay | Admitting: Emergency Medicine

## 2012-01-07 DIAGNOSIS — F329 Major depressive disorder, single episode, unspecified: Secondary | ICD-10-CM | POA: Insufficient documentation

## 2012-01-07 DIAGNOSIS — X58XXXA Exposure to other specified factors, initial encounter: Secondary | ICD-10-CM | POA: Insufficient documentation

## 2012-01-07 DIAGNOSIS — F209 Schizophrenia, unspecified: Secondary | ICD-10-CM | POA: Insufficient documentation

## 2012-01-07 DIAGNOSIS — I10 Essential (primary) hypertension: Secondary | ICD-10-CM | POA: Insufficient documentation

## 2012-01-07 DIAGNOSIS — Y9301 Activity, walking, marching and hiking: Secondary | ICD-10-CM | POA: Insufficient documentation

## 2012-01-07 DIAGNOSIS — G4730 Sleep apnea, unspecified: Secondary | ICD-10-CM | POA: Insufficient documentation

## 2012-01-07 DIAGNOSIS — F319 Bipolar disorder, unspecified: Secondary | ICD-10-CM | POA: Insufficient documentation

## 2012-01-07 DIAGNOSIS — Z79899 Other long term (current) drug therapy: Secondary | ICD-10-CM | POA: Insufficient documentation

## 2012-01-07 DIAGNOSIS — S93409A Sprain of unspecified ligament of unspecified ankle, initial encounter: Secondary | ICD-10-CM | POA: Insufficient documentation

## 2012-01-07 DIAGNOSIS — E669 Obesity, unspecified: Secondary | ICD-10-CM | POA: Insufficient documentation

## 2012-01-07 DIAGNOSIS — Y9289 Other specified places as the place of occurrence of the external cause: Secondary | ICD-10-CM | POA: Insufficient documentation

## 2012-01-07 DIAGNOSIS — F17200 Nicotine dependence, unspecified, uncomplicated: Secondary | ICD-10-CM | POA: Insufficient documentation

## 2012-01-07 DIAGNOSIS — F3289 Other specified depressive episodes: Secondary | ICD-10-CM | POA: Insufficient documentation

## 2012-01-07 MED ORDER — OXYCODONE HCL 5 MG PO TABS
5.0000 mg | ORAL_TABLET | Freq: Once | ORAL | Status: AC
  Administered 2012-01-07: 5 mg via ORAL
  Filled 2012-01-07: qty 1

## 2012-01-07 MED ORDER — OXYCODONE HCL 5 MG PO TABS: 8.0000 mg | ORAL_TABLET | ORAL | Status: DC | PRN

## 2012-01-07 MED ORDER — IBUPROFEN 800 MG PO TABS
800.0000 mg | ORAL_TABLET | Freq: Once | ORAL | Status: AC
  Administered 2012-01-07: 800 mg via ORAL
  Filled 2012-01-07: qty 1

## 2012-01-07 NOTE — Progress Notes (Signed)
Orthopedic Tech Progress Note Patient Details:  Ryan Horn 1975-09-08 [id number] Crutches issued to patient; fit for height and comfort. Patient demonstrated proper crutch use Ortho Devices Type of Ortho Device: Crutches Ortho Device/Splint Interventions: Application   Asia R Thompson 01/07/2012, 7:38 AM

## 2012-01-07 NOTE — ED Notes (Signed)
Pt states that he dropped his cell phone and bent down to retrieve it and twisted his left ankle. States that he has not taken anything for the pain. Pt is homeless. Pt has slight swelling of the left ankle. Pt able to wiggle toes and rotate ankle with some pain. Good pedal pulse palpated.

## 2012-01-07 NOTE — ED Notes (Signed)
Patient complaining of left ankle injury; states that he tripped and fell off a curb.  Denies swelling.  Ambulatory in triage.

## 2012-01-07 NOTE — ED Provider Notes (Signed)
Medical screening examination/treatment/procedure(s) were performed by non-physician practitioner and as supervising physician I was immediately available for consultation/collaboration.  Olivia Mackie, MD 01/07/12 [phone]

## 2012-01-07 NOTE — ED Provider Notes (Signed)
History     CSN: [id number]  Arrival date & time 01/07/12  0430   First MD Initiated Contact with Patient 01/07/12 [phone]      Chief Complaint  Patient presents with  . Ankle Injury    (Consider location/radiation/quality/duration/timing/severity/associated sxs/prior treatment) HPI  LAFAYETTE DUNLEVY is a 36 y.o. male complaining of left ankle pain after patient converted ankle while walking on a curb earlier in the a.m. Pain is described as 10 out of 10, described as sharp and throbbing exacerbated by weightbearing and movement. Patient denies any numbness or paresthesia. He denies any prior trauma to this ankle.  Past Medical History  Diagnosis Date  . Obesity   . Hypertension   . Shortness of breath   . Sleep apnea   . Schizophrenia   . Bipolar 1 disorder   . Depression     Past Surgical History  Procedure Date  . Cholecystectomy     Family History  Problem Relation Age of Onset  . Anesthesia problems Mother     History  Substance Use Topics  . Smoking status: Current Every Day Smoker -- 0.2 packs/day for 22 years    Types: Cigarettes  . Smokeless tobacco: Not on file  . Alcohol Use: Yes     Comment: OCCASIONAL      Review of Systems  Constitutional: Negative for fever.  Respiratory: Negative for shortness of breath.   Cardiovascular: Negative for chest pain.  Gastrointestinal: Negative for nausea, vomiting, abdominal pain and diarrhea.  Musculoskeletal: Positive for arthralgias.  All other systems reviewed and are negative.    Allergies  Hydrocodone; Tramadol; and Tylenol  Home Medications   Current Outpatient Rx  Name  Route  Sig  Dispense  Refill  . ARIPIPRAZOLE 9.75 MG/1.3ML IM SOLN   Intramuscular   Inject 9.75 mg into the muscle every 30 (thirty) days.         Marland Kitchen DIVALPROEX SODIUM 500 MG PO TBEC   Oral   Take 500 mg by mouth 2 (two) times daily.         . GUAIFENESIN-CODEINE 100-10 MG/5ML PO SYRP   Oral   Take 5 mLs by mouth 3  (three) times daily as needed for cough.   180 mL   0   . HYDROXYZINE PAMOATE 25 MG PO CAPS   Oral   Take 25 mg by mouth 2 (two) times daily. For anxiety         . SERTRALINE HCL 50 MG PO TABS   Oral   Take 50 mg by mouth 2 (two) times daily.            BP 158/74  Pulse 67  Temp 97.2 F (36.2 C) (Oral)  Resp 18  SpO2 100%  Physical Exam  Nursing note and vitals reviewed. Constitutional: He is oriented to person, place, and time. He appears well-developed and well-nourished. No distress.  HENT:  Head: Normocephalic.  Eyes: Conjunctivae normal and EOM are normal.  Cardiovascular: Normal rate and regular rhythm.   Pulmonary/Chest: Effort normal. No stridor.  Abdominal: Bowel sounds are normal.  Musculoskeletal: Normal range of motion.       Swelling to inferior left lateral malleolus with tenderness and mild erythema. Dorsalis pedis and posterior tibial 2+ bilaterally excellent active range of motion to ankle and toes. Cap refill less than 2 seconds x5 digits distal sensation grossly intact.  Neurological: He is alert and oriented to person, place, and time.  Psychiatric: He has a normal  mood and affect.    ED Course  Procedures (including critical care time)  Labs Reviewed - No data to display Dg Ankle Complete Left  01/07/2012  *RADIOLOGY REPORT*  Clinical Data: Lateral ankle pain.  LEFT ANKLE COMPLETE - 3+ VIEW  Comparison: 06/20/2010  Findings: Mild lateral soft tissue swelling.  No underlying bony abnormality.  No fracture, subluxation or dislocation.  IMPRESSION: No acute bony abnormality.   Original Report Authenticated By: Charlett Nose, M.D.      1. Ankle sprain       MDM  Soft tissue swelling and tender to palpation of lateral malleolus. X-ray shows no bony abnormality. Patient will be given an air splint crutches and instructions for RICE.   Pt verbalized understanding and agrees with care plan. Outpatient follow-up and return precautions given.    New  Prescriptions   OXYCODONE (ROXICODONE) 5 MG IMMEDIATE RELEASE TABLET    Take 1.5 tablets (7.5 mg total) by mouth every 4 (four) hours as needed for pain.          Wynetta Emery, PA-C 01/07/12 0725

## 2012-01-14 ENCOUNTER — Encounter (HOSPITAL_COMMUNITY): Payer: Self-pay | Admitting: Emergency Medicine

## 2012-01-14 ENCOUNTER — Emergency Department (HOSPITAL_COMMUNITY)
Admission: EM | Admit: 2012-01-14 | Discharge: 2012-01-14 | Disposition: A | Discharge status: Home / Self Care | Payer: Self-pay | Attending: Emergency Medicine | Admitting: Emergency Medicine

## 2012-01-14 DIAGNOSIS — I10 Essential (primary) hypertension: Secondary | ICD-10-CM | POA: Insufficient documentation

## 2012-01-14 DIAGNOSIS — Z79899 Other long term (current) drug therapy: Secondary | ICD-10-CM | POA: Insufficient documentation

## 2012-01-14 DIAGNOSIS — F319 Bipolar disorder, unspecified: Secondary | ICD-10-CM | POA: Insufficient documentation

## 2012-01-14 DIAGNOSIS — IMO0002 Reserved for concepts with insufficient information to code with codable children: Secondary | ICD-10-CM | POA: Insufficient documentation

## 2012-01-14 DIAGNOSIS — Z8709 Personal history of other diseases of the respiratory system: Secondary | ICD-10-CM | POA: Insufficient documentation

## 2012-01-14 DIAGNOSIS — E669 Obesity, unspecified: Secondary | ICD-10-CM | POA: Insufficient documentation

## 2012-01-14 DIAGNOSIS — F209 Schizophrenia, unspecified: Secondary | ICD-10-CM | POA: Insufficient documentation

## 2012-01-14 DIAGNOSIS — Y929 Unspecified place or not applicable: Secondary | ICD-10-CM | POA: Insufficient documentation

## 2012-01-14 DIAGNOSIS — F329 Major depressive disorder, single episode, unspecified: Secondary | ICD-10-CM | POA: Insufficient documentation

## 2012-01-14 DIAGNOSIS — F17200 Nicotine dependence, unspecified, uncomplicated: Secondary | ICD-10-CM | POA: Insufficient documentation

## 2012-01-14 DIAGNOSIS — S39012A Strain of muscle, fascia and tendon of lower back, initial encounter: Secondary | ICD-10-CM

## 2012-01-14 DIAGNOSIS — F3289 Other specified depressive episodes: Secondary | ICD-10-CM | POA: Insufficient documentation

## 2012-01-14 DIAGNOSIS — Y9301 Activity, walking, marching and hiking: Secondary | ICD-10-CM | POA: Insufficient documentation

## 2012-01-14 DIAGNOSIS — Z9889 Other specified postprocedural states: Secondary | ICD-10-CM | POA: Insufficient documentation

## 2012-01-14 DIAGNOSIS — W010XXA Fall on same level from slipping, tripping and stumbling without subsequent striking against object, initial encounter: Secondary | ICD-10-CM | POA: Insufficient documentation

## 2012-01-14 MED ORDER — FENTANYL CITRATE 0.05 MG/ML IJ SOLN
100.0000 ug | Freq: Once | INTRAMUSCULAR | Status: AC
  Administered 2012-01-14: 100 ug via NASAL
  Filled 2012-01-14: qty 2

## 2012-01-14 NOTE — ED Provider Notes (Addendum)
History     CSN: [id number]  Arrival date & time 01/14/12  0445   First MD Initiated Contact with Patient 01/14/12 [phone]      Chief Complaint  Patient presents with  . Back Pain    (Consider location/radiation/quality/duration/timing/severity/associated sxs/prior treatment) HPI This 36 year old male says he was walking outside within the last couple hours when he slipped and twisted his back he did not fall, he has diffuse thoracolumbar back pain without radiation or associated symptoms, he is no change in bowel or bladder function, he is no weakness or numbness to his legs, he is able to walk, and he is no chest pain cough shortness breath abdominal pain, she is no neck pain, he does not have any fever or IV drug abuse. There is no treatment prior to arrival. His pain is severe worse with movement and palpation and better if he stays still. His pain is nonexertional. He states he does not want a prescription for medicine he just wants a pain medicine dose in the ED. Past Medical History  Diagnosis Date  . Obesity   . Hypertension   . Shortness of breath   . Sleep apnea   . Schizophrenia   . Bipolar 1 disorder   . Depression     Past Surgical History  Procedure Date  . Cholecystectomy     Family History  Problem Relation Age of Onset  . Anesthesia problems Mother     History  Substance Use Topics  . Smoking status: Current Every Day Smoker -- 0.2 packs/day for 22 years    Types: Cigarettes  . Smokeless tobacco: Not on file  . Alcohol Use: Yes     Comment: OCCASIONAL      Review of Systems 10 Systems reviewed and are negative for acute change except as noted in the HPI. Allergies  Hydrocodone; Tramadol; and Tylenol  Home Medications   Current Outpatient Rx  Name  Route  Sig  Dispense  Refill  . ARIPIPRAZOLE 9.75 MG/1.3ML IM SOLN   Intramuscular   Inject 9.75 mg into the muscle every 30 (thirty) days.         Marland Kitchen DIVALPROEX SODIUM 500 MG PO TBEC   Oral  Take 500 mg by mouth 2 (two) times daily.         . GUAIFENESIN-CODEINE 100-10 MG/5ML PO SYRP   Oral   Take 5 mLs by mouth 3 (three) times daily as needed for cough.   180 mL   0   . HYDROXYZINE PAMOATE 25 MG PO CAPS   Oral   Take 25 mg by mouth 2 (two) times daily. For anxiety         . IBUPROFEN 200 MG PO TABS   Oral   Take 600-800 mg by mouth every 6 (six) hours as needed. Back pain         . SERTRALINE HCL 50 MG PO TABS   Oral   Take 50 mg by mouth 2 (two) times daily.            BP 180/90  Pulse 70  Temp 97.8 F (36.6 C) (Oral)  Resp 18  SpO2 86%  Physical Exam  Nursing note and vitals reviewed. Constitutional:       Awake, alert, nontoxic appearance with baseline speech.  HENT:  Head: Atraumatic.  Eyes: Pupils are equal, round, and reactive to light. Right eye exhibits no discharge. Left eye exhibits no discharge.  Neck: Neck supple.  Cardiovascular: Normal rate and  regular rhythm.   No murmur heard. Pulmonary/Chest: Effort normal and breath sounds normal. No respiratory distress. He has no wheezes. He has no rales. He exhibits no tenderness.  Abdominal: Soft. Bowel sounds are normal. He exhibits no mass. There is no tenderness. There is no rebound.  Musculoskeletal: He exhibits tenderness. He exhibits no edema.       Thoracic back: He exhibits no tenderness.       Lumbar back: He exhibits no tenderness.       Bilateral lower extremities non tender without new rashes or color change, baseline ROM with intact DP pulses, CR<2 secs all digits bilaterally, sensation baseline light touch bilaterally for pt, DTR's symmetric and intact bilaterally KJ / AJ, motor symmetric bilateral 5 / 5 hip flexion, quadriceps, hamstrings, EHL, foot dorsiflexion, foot plantarflexion, gait somewhat antalgic but without apparent new ataxia. Cervical spine is nontender. Back has diffuse thoracic or lumbar midline and parathoracic and paralumbar tenderness.  Neurological: He is alert.        Mental status baseline for patient.  Upper extremity motor strength and sensation intact and symmetric bilaterally.  Skin: No rash noted.  Psychiatric: He has a normal mood and affect.    ED Course  Procedures (including critical care time)  Labs Reviewed - No data to display No results found.   1. Back strain       MDM  Patient / Family / Caregiver informed of clinical course, understand medical decision-making process, and agree with plan.  Pt stable in ED with no significant deterioration in condition.  I doubt any other EMC precluding discharge at this time including, but not necessarily limited to the following:cord innjury, cauda equina.        Hurman Horn, MD 01/15/12 1315  Hurman Horn, MD 01/15/12 [phone]

## 2012-01-14 NOTE — ED Notes (Signed)
Pt. States that tonight when he was walking his slipped and hurt his back.

## 2012-05-08 ENCOUNTER — Encounter (HOSPITAL_COMMUNITY): Payer: Self-pay

## 2012-05-08 ENCOUNTER — Emergency Department (HOSPITAL_COMMUNITY): Payer: Self-pay

## 2012-05-08 ENCOUNTER — Emergency Department (HOSPITAL_COMMUNITY)
Admission: EM | Admit: 2012-05-08 | Discharge: 2012-05-08 | Disposition: A | Discharge status: Home / Self Care | Payer: Self-pay | Attending: Emergency Medicine | Admitting: Emergency Medicine

## 2012-05-08 DIAGNOSIS — Y93H2 Activity, gardening and landscaping: Secondary | ICD-10-CM | POA: Insufficient documentation

## 2012-05-08 DIAGNOSIS — I10 Essential (primary) hypertension: Secondary | ICD-10-CM | POA: Insufficient documentation

## 2012-05-08 DIAGNOSIS — Z79899 Other long term (current) drug therapy: Secondary | ICD-10-CM | POA: Insufficient documentation

## 2012-05-08 DIAGNOSIS — M545 Low back pain: Secondary | ICD-10-CM

## 2012-05-08 DIAGNOSIS — E669 Obesity, unspecified: Secondary | ICD-10-CM | POA: Insufficient documentation

## 2012-05-08 DIAGNOSIS — Z7982 Long term (current) use of aspirin: Secondary | ICD-10-CM | POA: Insufficient documentation

## 2012-05-08 DIAGNOSIS — M549 Dorsalgia, unspecified: Secondary | ICD-10-CM | POA: Insufficient documentation

## 2012-05-08 DIAGNOSIS — W19XXXA Unspecified fall, initial encounter: Secondary | ICD-10-CM

## 2012-05-08 DIAGNOSIS — W11XXXA Fall on and from ladder, initial encounter: Secondary | ICD-10-CM | POA: Insufficient documentation

## 2012-05-08 DIAGNOSIS — Y929 Unspecified place or not applicable: Secondary | ICD-10-CM | POA: Insufficient documentation

## 2012-05-08 DIAGNOSIS — F17200 Nicotine dependence, unspecified, uncomplicated: Secondary | ICD-10-CM | POA: Insufficient documentation

## 2012-05-08 DIAGNOSIS — F319 Bipolar disorder, unspecified: Secondary | ICD-10-CM | POA: Insufficient documentation

## 2012-05-08 DIAGNOSIS — F209 Schizophrenia, unspecified: Secondary | ICD-10-CM | POA: Insufficient documentation

## 2012-05-08 DIAGNOSIS — G4730 Sleep apnea, unspecified: Secondary | ICD-10-CM | POA: Insufficient documentation

## 2012-05-08 DIAGNOSIS — Z8709 Personal history of other diseases of the respiratory system: Secondary | ICD-10-CM | POA: Insufficient documentation

## 2012-05-08 MED ORDER — METHOCARBAMOL 500 MG PO TABS: 500.0000 mg | ORAL_TABLET | Freq: Two times a day (BID) | ORAL | Status: DC

## 2012-05-08 MED ORDER — LISINOPRIL 20 MG PO TABS: 20.0000 mg | ORAL_TABLET | Freq: Every day | ORAL | Status: DC

## 2012-05-08 MED ORDER — NAPROXEN 500 MG PO TABS: 500.0000 mg | ORAL_TABLET | Freq: Two times a day (BID) | ORAL | Status: DC

## 2012-05-08 NOTE — ED Provider Notes (Signed)
History     CSN: [id number]  Arrival date & time 05/08/12  0007   First MD Initiated Contact with Patient 05/08/12 0110      Chief Complaint  Patient presents with  . Fall   HPI  History provided by the patient. Patient is a 37 year old male with history of obesity, hypertension and schizophrenia who presents with complaints of lower and mid back pain after a fall. Patient states that he was on a ladder yesterday afternoon cutting a tree branch for a sister when a branch swelling down hitting the ladder knocking him off onto the ground. Patient was 3-5 steps up on a ladder and fell backwards directly onto his buttocks and back. He denies having significant head injury no LOC. He landed on soft grass. Since that time his had pain in his sacral area and midback. Pain is worse with sitting in certain movements. He has tried using over-the-counter pain medicines without any significant relief. He has not used any other treatments. Pain does not radiate. Denies any weakness or numbness in the lower extremities. No urinary or fecal incontinence, urinary tension or perineal numbness.    Past Medical History  Diagnosis Date  . Obesity   . Hypertension   . Shortness of breath   . Sleep apnea   . Schizophrenia   . Bipolar 1 disorder   . Depression     Past Surgical History  Procedure Laterality Date  . Cholecystectomy      Family History  Problem Relation Age of Onset  . Anesthesia problems Mother     History  Substance Use Topics  . Smoking status: Current Every Day Smoker -- 0.25 packs/day for 22 years    Types: Cigarettes  . Smokeless tobacco: Not on file  . Alcohol Use: Yes     Comment: OCCASIONAL      Review of Systems  HENT: Negative for neck pain.   Respiratory: Negative for shortness of breath.   Cardiovascular: Negative for chest pain.  Musculoskeletal: Positive for back pain.  Neurological: Negative for weakness, numbness and headaches.  All other systems  reviewed and are negative.    Allergies  Hydrocodone; Tramadol; and Tylenol  Home Medications   Current Outpatient Rx  Name  Route  Sig  Dispense  Refill  . ASPIRIN PO   Oral   Take 1 tablet by mouth every 6 (six) hours as needed (pain).         . hydrOXYzine (VISTARIL) 25 MG capsule   Oral   Take 25 mg by mouth 2 (two) times daily. For anxiety         . LISINOPRIL PO   Oral   Take 1 tablet by mouth daily.         Marland Kitchen RisperiDONE (RISPERDAL PO)   Oral   Take 1 tablet by mouth daily.         . sertraline (ZOLOFT) 50 MG tablet   Oral   Take 50 mg by mouth 2 (two) times daily.            BP 204/107  Pulse 91  Temp(Src) 98.1 F (36.7 C) (Oral)  Resp 15  SpO2 95%  Physical Exam  Nursing note and vitals reviewed. Constitutional: He is oriented to person, place, and time. He appears well-developed and well-nourished. No distress.  HENT:  Head: Normocephalic and atraumatic.  Neck: Normal range of motion. Neck supple.  Cardiovascular: Normal rate and regular rhythm.   Pulmonary/Chest: Effort normal and breath  sounds normal. No respiratory distress. He has no wheezes. He has no rales. He exhibits no tenderness.  Abdominal: Soft.  Musculoskeletal: Normal range of motion. He exhibits no edema and no tenderness.       Cervical back: Normal.       Thoracic back: He exhibits tenderness.       Lumbar back: He exhibits tenderness. He exhibits normal range of motion and no bony tenderness.       Back:  Neurological: He is alert and oriented to person, place, and time. He has normal strength. No sensory deficit. Gait normal.  Skin: Skin is warm.  Psychiatric: He has a normal mood and affect. His behavior is normal.    ED Course  Procedures   Dg Thoracic Spine W/swimmers  05/08/2012  *RADIOLOGY REPORT*  Clinical Data: Status post fall; pain between the shoulder blades.  THORACIC SPINE - 2 VIEW + SWIMMERS  Comparison: Chest radiograph performed 12/29/2011  Findings:  There is no evidence of fracture or subluxation. Vertebral bodies demonstrate normal height and alignment. Intervertebral disc spaces are preserved.  The visualized portions of both lungs are clear.  The mediastinum is unremarkable in appearance.  Clips are noted within the right upper quadrant, reflecting prior cholecystectomy.  IMPRESSION: No evidence of fracture or subluxation along the thoracic spine.   Original Report Authenticated By: Tonia Ghent, M.D.    Dg Lumbar Spine Complete  05/08/2012  *RADIOLOGY REPORT*  Clinical Data: Status post fall; lower back pain.  LUMBAR SPINE - COMPLETE 4+ VIEW  Comparison: Lumbar spine radiographs performed 05/27/2011, and CT of the abdomen and pelvis performed 12/14/2010  Findings: There is no evidence of fracture or subluxation. Vertebral bodies demonstrate normal height and alignment. Intervertebral disc spaces are preserved.  The visualized neural foramina are grossly unremarkable in appearance.  The visualized bowel gas pattern is unremarkable in appearance; air and stool are noted within the colon.  The sacroiliac joints are within normal limits.  Clips are noted within the right upper quadrant, reflecting prior cholecystectomy.  IMPRESSION: No evidence of fracture or subluxation along the lumbar spine.   Original Report Authenticated By: Tonia Ghent, M.D.    Dg Sacrum/coccyx  05/08/2012  *RADIOLOGY REPORT*  Clinical Data: Status post fall; coccygeal pain.  SACRUM AND COCCYX - 2+ VIEW  Comparison: CT of the abdomen and pelvis performed 12/14/2010  Findings: There is no evidence of fracture or dislocation.  The sacrum and coccyx appear intact.  The sacroiliac joints are unremarkable in appearance.  The lower lumbar spine is grossly unremarkable.  The visualized bowel gas pattern is grossly unremarkable.  IMPRESSION: No evidence of fracture or dislocation.   Original Report Authenticated By: Tonia Ghent, M.D.      1. Fall, initial encounter   2. Low back  pain       MDM  Patient seen and evaluated. Patient well-appearing in no acute distress. Patient is hypertensive but reports being out of her blood pressure medications. She does have refills pending but has just not gone to the pharmacy.         Angus Seller, PA-C 05/08/12 2140

## 2012-05-08 NOTE — ED Notes (Signed)
Patient c/o bilateral shoulder pain and mid lower back pain after falling from a ladder earlier this afternoon around 1 pm. Patient was trimming tree limbs. Reports falling < 3 feet. Denies hitting head or LOC. Denies numbness or tingling to lower extremities. No bowel or bladder incontinence.    Additionally, patient hypertensive at triage. Reports hx of the same. Was taking lisinopril for this while incarcerated but none since his release on 4/6. Denies headaches or dizziness.

## 2012-05-08 NOTE — ED Notes (Signed)
Patient said she was trimming his sister's tree and was on a ladder.  The patient said he fell off an 8 foot ladder on his bottom and his back.  The patient said he thought his pain would go away and it got worse so he came in to get it evaluated.

## 2012-05-09 NOTE — ED Provider Notes (Signed)
Medical screening examination/treatment/procedure(s) were performed by non-physician practitioner and as supervising physician I was immediately available for consultation/collaboration.  Durwood Dittus, MD 05/09/12 0620 

## 2012-05-25 ENCOUNTER — Encounter (HOSPITAL_COMMUNITY): Payer: Self-pay | Admitting: Adult Health

## 2012-05-25 DIAGNOSIS — Z79899 Other long term (current) drug therapy: Secondary | ICD-10-CM | POA: Insufficient documentation

## 2012-05-25 DIAGNOSIS — E669 Obesity, unspecified: Secondary | ICD-10-CM | POA: Insufficient documentation

## 2012-05-25 DIAGNOSIS — F209 Schizophrenia, unspecified: Secondary | ICD-10-CM | POA: Insufficient documentation

## 2012-05-25 DIAGNOSIS — J069 Acute upper respiratory infection, unspecified: Secondary | ICD-10-CM | POA: Insufficient documentation

## 2012-05-25 DIAGNOSIS — I10 Essential (primary) hypertension: Secondary | ICD-10-CM | POA: Insufficient documentation

## 2012-05-25 DIAGNOSIS — J3489 Other specified disorders of nose and nasal sinuses: Secondary | ICD-10-CM | POA: Insufficient documentation

## 2012-05-25 DIAGNOSIS — Z8709 Personal history of other diseases of the respiratory system: Secondary | ICD-10-CM | POA: Insufficient documentation

## 2012-05-25 DIAGNOSIS — R0982 Postnasal drip: Secondary | ICD-10-CM | POA: Insufficient documentation

## 2012-05-25 DIAGNOSIS — Z8669 Personal history of other diseases of the nervous system and sense organs: Secondary | ICD-10-CM | POA: Insufficient documentation

## 2012-05-25 DIAGNOSIS — F319 Bipolar disorder, unspecified: Secondary | ICD-10-CM | POA: Insufficient documentation

## 2012-05-25 DIAGNOSIS — J029 Acute pharyngitis, unspecified: Secondary | ICD-10-CM | POA: Insufficient documentation

## 2012-05-25 DIAGNOSIS — F17200 Nicotine dependence, unspecified, uncomplicated: Secondary | ICD-10-CM | POA: Insufficient documentation

## 2012-05-25 NOTE — ED Notes (Addendum)
Presents with cough that began yesterday associated with pain when coughing and feeling SOB with exertion, today began having a sore throat. Throat is red. Cough is producutive with yellow phlegm.

## 2012-05-26 ENCOUNTER — Emergency Department (HOSPITAL_COMMUNITY)
Admission: EM | Admit: 2012-05-26 | Discharge: 2012-05-26 | Disposition: A | Discharge status: Home / Self Care | Payer: Self-pay | Attending: Emergency Medicine | Admitting: Emergency Medicine

## 2012-05-26 ENCOUNTER — Emergency Department (HOSPITAL_COMMUNITY): Payer: Self-pay

## 2012-05-26 DIAGNOSIS — J069 Acute upper respiratory infection, unspecified: Secondary | ICD-10-CM

## 2012-05-26 MED ORDER — CETIRIZINE-PSEUDOEPHEDRINE ER 5-120 MG PO TB12: 1.0000 | ORAL_TABLET | Freq: Two times a day (BID) | ORAL | Status: DC

## 2012-05-26 MED ORDER — DIPHENHYDRAMINE HCL 12.5 MG/5ML PO ELIX
25.0000 mg | ORAL_SOLUTION | Freq: Once | ORAL | Status: AC
  Administered 2012-05-26: 25 mg via ORAL
  Filled 2012-05-26: qty 10

## 2012-05-26 MED ORDER — GUAIFENESIN ER 600 MG PO TB12: 1200.0000 mg | ORAL_TABLET | Freq: Two times a day (BID) | ORAL | Status: DC

## 2012-05-26 NOTE — ED Notes (Signed)
No new changes from initial triage assessment

## 2012-05-26 NOTE — ED Provider Notes (Signed)
History     CSN: [id number]  Arrival date & time 05/25/12  2308   First MD Initiated Contact with Patient 05/26/12 0057      Chief Complaint  Patient presents with  . Sore Throat  . Cough   HPI  History provided by the patient. Patient is a 37 year old male with history of hypertension, bipolar disorder and schizophrenia presents with complaints of sore throat, coughing congestion symptoms. Does have been present for the past one to 2 days. Cough is often productive of white and yellow phlegm. He denies any associated shortness of breath. Denies any chest pains. No fever, chills or sweats. Patient has not been taking any medications to help with symptoms. Denies any other aggravating or alleviating factors. No recent foreign travel. No known sick contacts.     Past Medical History  Diagnosis Date  . Obesity   . Hypertension   . Shortness of breath   . Sleep apnea   . Schizophrenia   . Bipolar 1 disorder   . Depression     Past Surgical History  Procedure Laterality Date  . Cholecystectomy      Family History  Problem Relation Age of Onset  . Anesthesia problems Mother     History  Substance Use Topics  . Smoking status: Current Every Day Smoker -- 0.25 packs/day for 22 years    Types: Cigarettes  . Smokeless tobacco: Not on file  . Alcohol Use: Yes     Comment: OCCASIONAL      Review of Systems  Constitutional: Negative for fever, chills and diaphoresis.  HENT: Positive for congestion, sore throat and postnasal drip.   Respiratory: Positive for cough.   Gastrointestinal: Negative for nausea, vomiting and diarrhea.  All other systems reviewed and are negative.    Allergies  Hydrocodone; Tramadol; and Tylenol  Home Medications   Current Outpatient Rx  Name  Route  Sig  Dispense  Refill  . divalproex (DEPAKOTE) 250 MG DR tablet   Oral   Take 250 mg by mouth 2 (two) times daily.         . hydrOXYzine (VISTARIL) 25 MG capsule   Oral   Take 25 mg  by mouth 2 (two) times daily. For anxiety         . risperiDONE (RISPERDAL) 0.5 MG tablet   Oral   Take 0.25 mg by mouth daily.         . sertraline (ZOLOFT) 50 MG tablet   Oral   Take 50 mg by mouth 2 (two) times daily.          Marland Kitchen lisinopril (PRINIVIL,ZESTRIL) 20 MG tablet   Oral   Take 1 tablet (20 mg total) by mouth daily.   30 tablet   0     BP 177/108  Temp(Src) 99.6 F (37.6 C) (Oral)  Resp 18  SpO2 96%  Physical Exam  Nursing note and vitals reviewed. Constitutional: He is oriented to person, place, and time. He appears well-developed and well-nourished. No distress.  HENT:  Head: Normocephalic.  Right Ear: Tympanic membrane normal.  Left Ear: Tympanic membrane normal.  Very mild erythema of the pharynx. Tonsils appear normal in size without exudate. Uvula midline. No signs concerning for PTA.  Neck: Neck supple.  Cardiovascular: Normal rate and regular rhythm.   Pulmonary/Chest: Effort normal and breath sounds normal. No respiratory distress. He has no wheezes. He has no rales.  Abdominal: Soft. There is no tenderness. There is no rebound and no  guarding.  Musculoskeletal: Normal range of motion.  Lymphadenopathy:    He has no cervical adenopathy.  Neurological: He is alert and oriented to person, place, and time.  Skin: Skin is warm.  Psychiatric: He has a normal mood and affect. His behavior is normal.    ED Course  Procedures   Labs Reviewed  RAPID STREP SCREEN   Dg Chest 2 View  05/26/2012  *RADIOLOGY REPORT*  Clinical Data: Sore throat, cough, and nausea.  CHEST - 2 VIEW  Comparison: 12/29/2011  Findings: Slightly shallow inspiration. The heart size and pulmonary vascularity are normal. The lungs appear clear and expanded without focal air space disease or consolidation. No blunting of the costophrenic angles.  No pneumothorax.  Mediastinal contours appear intact.  No significant change since previous study.  IMPRESSION: No evidence of active  pulmonary disease.   Original Report Authenticated By: Burman Nieves, M.D.      1. URI (upper respiratory infection)       MDM  Patient seen and evaluated. Patient well-appearing in no acute distress. Normal respirations and O2 sats.        Angus Seller, PA-C 05/26/12 2122

## 2012-05-27 NOTE — ED Provider Notes (Signed)
Medical screening examination/treatment/procedure(s) were performed by non-physician practitioner and as supervising physician I was immediately available for consultation/collaboration.  Denorris Reust, MD 05/27/12 0418 

## 2012-08-10 ENCOUNTER — Emergency Department (HOSPITAL_COMMUNITY): Payer: No Typology Code available for payment source

## 2012-08-10 ENCOUNTER — Emergency Department (HOSPITAL_COMMUNITY)
Admission: EM | Admit: 2012-08-10 | Discharge: 2012-08-10 | Disposition: A | Discharge status: Home / Self Care | Payer: No Typology Code available for payment source | Attending: Emergency Medicine | Admitting: Emergency Medicine

## 2012-08-10 ENCOUNTER — Encounter (HOSPITAL_COMMUNITY): Payer: Self-pay | Admitting: Emergency Medicine

## 2012-08-10 DIAGNOSIS — Z8659 Personal history of other mental and behavioral disorders: Secondary | ICD-10-CM | POA: Insufficient documentation

## 2012-08-10 DIAGNOSIS — Y92410 Unspecified street and highway as the place of occurrence of the external cause: Secondary | ICD-10-CM | POA: Insufficient documentation

## 2012-08-10 DIAGNOSIS — S161XXA Strain of muscle, fascia and tendon at neck level, initial encounter: Secondary | ICD-10-CM

## 2012-08-10 DIAGNOSIS — E669 Obesity, unspecified: Secondary | ICD-10-CM | POA: Insufficient documentation

## 2012-08-10 DIAGNOSIS — F209 Schizophrenia, unspecified: Secondary | ICD-10-CM | POA: Insufficient documentation

## 2012-08-10 DIAGNOSIS — F17200 Nicotine dependence, unspecified, uncomplicated: Secondary | ICD-10-CM | POA: Insufficient documentation

## 2012-08-10 DIAGNOSIS — Y9389 Activity, other specified: Secondary | ICD-10-CM | POA: Insufficient documentation

## 2012-08-10 DIAGNOSIS — F319 Bipolar disorder, unspecified: Secondary | ICD-10-CM | POA: Insufficient documentation

## 2012-08-10 DIAGNOSIS — S0990XA Unspecified injury of head, initial encounter: Secondary | ICD-10-CM | POA: Insufficient documentation

## 2012-08-10 DIAGNOSIS — G4730 Sleep apnea, unspecified: Secondary | ICD-10-CM | POA: Insufficient documentation

## 2012-08-10 DIAGNOSIS — S139XXA Sprain of joints and ligaments of unspecified parts of neck, initial encounter: Secondary | ICD-10-CM | POA: Insufficient documentation

## 2012-08-10 DIAGNOSIS — Z79899 Other long term (current) drug therapy: Secondary | ICD-10-CM | POA: Insufficient documentation

## 2012-08-10 DIAGNOSIS — I10 Essential (primary) hypertension: Secondary | ICD-10-CM | POA: Insufficient documentation

## 2012-08-10 MED ORDER — IBUPROFEN 800 MG PO TABS
800.0000 mg | ORAL_TABLET | Freq: Once | ORAL | Status: AC
  Administered 2012-08-10: 800 mg via ORAL
  Filled 2012-08-10: qty 1

## 2012-08-10 MED ORDER — DIAZEPAM 5 MG PO TABS
10.0000 mg | ORAL_TABLET | Freq: Once | ORAL | Status: AC
  Administered 2012-08-10: 10 mg via ORAL
  Filled 2012-08-10: qty 2

## 2012-08-10 MED ORDER — OXYCODONE-ACETAMINOPHEN 5-325 MG PO TABS
2.0000 | ORAL_TABLET | Freq: Once | ORAL | Status: AC
  Administered 2012-08-10: 2 via ORAL
  Filled 2012-08-10: qty 2

## 2012-08-10 MED ORDER — NAPROXEN 500 MG PO TABS: 500.0000 mg | ORAL_TABLET | Freq: Two times a day (BID) | ORAL | Status: DC | PRN

## 2012-08-10 MED ORDER — METHOCARBAMOL 750 MG PO TABS: 750.0000 mg | ORAL_TABLET | Freq: Four times a day (QID) | ORAL | Status: DC | PRN

## 2012-08-10 MED ORDER — OXYCODONE-ACETAMINOPHEN 5-325 MG PO TABS: 1.0000 | ORAL_TABLET | ORAL | Status: DC | PRN

## 2012-08-10 NOTE — ED Provider Notes (Signed)
  Medical screening examination/treatment/procedure(s) were performed by non-physician practitioner and as supervising physician I was immediately available for consultation/collaboration.    Gerhard Munch, MD 08/10/12 [phone]
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
## 2012-08-11 ENCOUNTER — Emergency Department (HOSPITAL_COMMUNITY): Payer: No Typology Code available for payment source

## 2012-08-11 ENCOUNTER — Encounter (HOSPITAL_COMMUNITY): Payer: Self-pay | Admitting: *Deleted

## 2012-08-11 ENCOUNTER — Emergency Department (HOSPITAL_COMMUNITY)
Admission: EM | Admit: 2012-08-11 | Discharge: 2012-08-11 | Disposition: A | Discharge status: Home / Self Care | Payer: No Typology Code available for payment source | Attending: Emergency Medicine | Admitting: Emergency Medicine

## 2012-08-11 DIAGNOSIS — Z79899 Other long term (current) drug therapy: Secondary | ICD-10-CM | POA: Insufficient documentation

## 2012-08-11 DIAGNOSIS — Z8659 Personal history of other mental and behavioral disorders: Secondary | ICD-10-CM | POA: Insufficient documentation

## 2012-08-11 DIAGNOSIS — E669 Obesity, unspecified: Secondary | ICD-10-CM | POA: Insufficient documentation

## 2012-08-11 DIAGNOSIS — G4730 Sleep apnea, unspecified: Secondary | ICD-10-CM | POA: Insufficient documentation

## 2012-08-11 DIAGNOSIS — Y9389 Activity, other specified: Secondary | ICD-10-CM | POA: Insufficient documentation

## 2012-08-11 DIAGNOSIS — Y92410 Unspecified street and highway as the place of occurrence of the external cause: Secondary | ICD-10-CM | POA: Insufficient documentation

## 2012-08-11 DIAGNOSIS — S0990XA Unspecified injury of head, initial encounter: Secondary | ICD-10-CM | POA: Insufficient documentation

## 2012-08-11 DIAGNOSIS — S0993XA Unspecified injury of face, initial encounter: Secondary | ICD-10-CM | POA: Insufficient documentation

## 2012-08-11 DIAGNOSIS — M542 Cervicalgia: Secondary | ICD-10-CM

## 2012-08-11 DIAGNOSIS — F17200 Nicotine dependence, unspecified, uncomplicated: Secondary | ICD-10-CM | POA: Insufficient documentation

## 2012-08-11 DIAGNOSIS — I10 Essential (primary) hypertension: Secondary | ICD-10-CM | POA: Insufficient documentation

## 2012-08-11 DIAGNOSIS — M549 Dorsalgia, unspecified: Secondary | ICD-10-CM

## 2012-08-11 DIAGNOSIS — IMO0002 Reserved for concepts with insufficient information to code with codable children: Secondary | ICD-10-CM | POA: Insufficient documentation

## 2012-08-11 MED ORDER — CYCLOBENZAPRINE HCL 10 MG PO TABS: 10.0000 mg | ORAL_TABLET | Freq: Three times a day (TID) | ORAL | Status: DC | PRN

## 2012-08-11 MED ORDER — CYCLOBENZAPRINE HCL 10 MG PO TABS
10.0000 mg | ORAL_TABLET | Freq: Once | ORAL | Status: AC
  Administered 2012-08-11: 10 mg via ORAL
  Filled 2012-08-11: qty 1

## 2012-08-11 MED ORDER — IBUPROFEN 600 MG PO TABS: 600.0000 mg | ORAL_TABLET | Freq: Four times a day (QID) | ORAL | Status: DC | PRN

## 2012-08-11 MED ORDER — IBUPROFEN 800 MG PO TABS
800.0000 mg | ORAL_TABLET | Freq: Once | ORAL | Status: AC
  Administered 2012-08-11: 800 mg via ORAL
  Filled 2012-08-11: qty 1
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
## 2012-08-14 ENCOUNTER — Encounter (HOSPITAL_COMMUNITY): Payer: Self-pay | Admitting: Emergency Medicine

## 2012-08-14 ENCOUNTER — Emergency Department (HOSPITAL_COMMUNITY)
Admission: EM | Admit: 2012-08-14 | Discharge: 2012-08-14 | Disposition: A | Discharge status: Home / Self Care | Payer: No Typology Code available for payment source | Attending: Emergency Medicine | Admitting: Emergency Medicine

## 2012-08-14 DIAGNOSIS — Z8639 Personal history of other endocrine, nutritional and metabolic disease: Secondary | ICD-10-CM | POA: Insufficient documentation

## 2012-08-14 DIAGNOSIS — Z862 Personal history of diseases of the blood and blood-forming organs and certain disorders involving the immune mechanism: Secondary | ICD-10-CM | POA: Insufficient documentation

## 2012-08-14 DIAGNOSIS — I10 Essential (primary) hypertension: Secondary | ICD-10-CM | POA: Insufficient documentation

## 2012-08-14 DIAGNOSIS — S161XXD Strain of muscle, fascia and tendon at neck level, subsequent encounter: Secondary | ICD-10-CM

## 2012-08-14 DIAGNOSIS — Y9389 Activity, other specified: Secondary | ICD-10-CM | POA: Insufficient documentation

## 2012-08-14 DIAGNOSIS — Z8659 Personal history of other mental and behavioral disorders: Secondary | ICD-10-CM | POA: Insufficient documentation

## 2012-08-14 DIAGNOSIS — G8929 Other chronic pain: Secondary | ICD-10-CM | POA: Insufficient documentation

## 2012-08-14 DIAGNOSIS — S139XXA Sprain of joints and ligaments of unspecified parts of neck, initial encounter: Secondary | ICD-10-CM | POA: Insufficient documentation

## 2012-08-14 DIAGNOSIS — M62838 Other muscle spasm: Secondary | ICD-10-CM

## 2012-08-14 DIAGNOSIS — F17200 Nicotine dependence, unspecified, uncomplicated: Secondary | ICD-10-CM | POA: Insufficient documentation

## 2012-08-14 DIAGNOSIS — Y92410 Unspecified street and highway as the place of occurrence of the external cause: Secondary | ICD-10-CM | POA: Insufficient documentation

## 2012-08-14 DIAGNOSIS — R51 Headache: Secondary | ICD-10-CM | POA: Insufficient documentation

## 2012-08-14 HISTORY — DX: Dorsalgia, unspecified: M54.9

## 2012-08-14 HISTORY — DX: Other chronic pain: G89.29

## 2012-08-14 MED ORDER — METHOCARBAMOL 500 MG PO TABS: 1000.0000 mg | ORAL_TABLET | Freq: Four times a day (QID) | ORAL | Status: DC | PRN

## 2012-08-14 MED ORDER — OXYCODONE-ACETAMINOPHEN 5-325 MG PO TABS: ORAL_TABLET | ORAL | Status: DC

## 2012-08-14 MED ORDER — NAPROXEN 250 MG PO TABS: 250.0000 mg | ORAL_TABLET | Freq: Two times a day (BID) | ORAL | Status: DC
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
## 2012-08-14 NOTE — ED Notes (Signed)
Called from triage x2. No answer from PT or Family.
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
## 2012-08-14 NOTE — ED Notes (Signed)
MD McManus at bedside. 
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
## 2012-08-20 ENCOUNTER — Encounter (HOSPITAL_COMMUNITY): Payer: Self-pay | Admitting: Emergency Medicine

## 2012-08-20 ENCOUNTER — Emergency Department (HOSPITAL_COMMUNITY)
Admission: EM | Admit: 2012-08-20 | Discharge: 2012-08-20 | Disposition: A | Discharge status: Home / Self Care | Payer: No Typology Code available for payment source | Attending: Emergency Medicine | Admitting: Emergency Medicine

## 2012-08-20 ENCOUNTER — Emergency Department (HOSPITAL_COMMUNITY): Payer: No Typology Code available for payment source

## 2012-08-20 DIAGNOSIS — Z87828 Personal history of other (healed) physical injury and trauma: Secondary | ICD-10-CM | POA: Insufficient documentation

## 2012-08-20 DIAGNOSIS — R209 Unspecified disturbances of skin sensation: Secondary | ICD-10-CM | POA: Insufficient documentation

## 2012-08-20 DIAGNOSIS — R51 Headache: Secondary | ICD-10-CM | POA: Insufficient documentation

## 2012-08-20 DIAGNOSIS — M545 Low back pain, unspecified: Secondary | ICD-10-CM | POA: Insufficient documentation

## 2012-08-20 DIAGNOSIS — Z8659 Personal history of other mental and behavioral disorders: Secondary | ICD-10-CM | POA: Insufficient documentation

## 2012-08-20 DIAGNOSIS — M519 Unspecified thoracic, thoracolumbar and lumbosacral intervertebral disc disorder: Secondary | ICD-10-CM | POA: Insufficient documentation

## 2012-08-20 DIAGNOSIS — IMO0002 Reserved for concepts with insufficient information to code with codable children: Secondary | ICD-10-CM

## 2012-08-20 DIAGNOSIS — E669 Obesity, unspecified: Secondary | ICD-10-CM | POA: Insufficient documentation

## 2012-08-20 DIAGNOSIS — F17200 Nicotine dependence, unspecified, uncomplicated: Secondary | ICD-10-CM | POA: Insufficient documentation

## 2012-08-20 DIAGNOSIS — I10 Essential (primary) hypertension: Secondary | ICD-10-CM | POA: Insufficient documentation

## 2012-08-20 MED ORDER — DIAZEPAM 5 MG PO TABS: 5.0000 mg | ORAL_TABLET | Freq: Three times a day (TID) | ORAL | Status: DC | PRN

## 2012-08-20 MED ORDER — DIAZEPAM 5 MG PO TABS
5.0000 mg | ORAL_TABLET | Freq: Once | ORAL | Status: AC
  Administered 2012-08-20: 5 mg via ORAL
  Filled 2012-08-20: qty 1

## 2012-08-20 NOTE — ED Provider Notes (Signed)
Medical screening examination/treatment/procedure(s) were performed by non-physician practitioner and as supervising physician I was immediately available for consultation/collaboration.  Nashanti Duquette, MD 08/20/12 1554 

## 2012-08-20 NOTE — ED Notes (Signed)
PA student at bedside to speak with patient. 
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
## 2012-11-23 ENCOUNTER — Emergency Department (HOSPITAL_COMMUNITY)
Admission: EM | Admit: 2012-11-23 | Discharge: 2012-11-23 | Disposition: A | Discharge status: Other Acute Inpatient Hospital | Payer: No Typology Code available for payment source

## 2012-11-23 ENCOUNTER — Other Ambulatory Visit (HOSPITAL_COMMUNITY): Payer: Self-pay | Admitting: Nurse Practitioner

## 2012-11-23 ENCOUNTER — Ambulatory Visit (HOSPITAL_COMMUNITY)
Admission: RE | Admit: 2012-11-23 | Discharge: 2012-11-23 | Disposition: A | Discharge status: Home / Self Care | Payer: Self-pay | Source: Ambulatory Visit | Attending: Nurse Practitioner | Admitting: Nurse Practitioner

## 2012-11-23 ENCOUNTER — Ambulatory Visit (HOSPITAL_COMMUNITY): Admission: EM | Admit: 2012-11-23 | Payer: Self-pay | Source: Ambulatory Visit

## 2012-11-23 DIAGNOSIS — M25521 Pain in right elbow: Secondary | ICD-10-CM

## 2012-11-23 DIAGNOSIS — M25529 Pain in unspecified elbow: Secondary | ICD-10-CM | POA: Insufficient documentation

## 2013-03-28 ENCOUNTER — Emergency Department (HOSPITAL_COMMUNITY)
Admission: EM | Admit: 2013-03-28 | Discharge: 2013-03-28 | Disposition: A | Discharge status: Home / Self Care | Payer: No Typology Code available for payment source | Attending: Emergency Medicine | Admitting: Emergency Medicine

## 2013-03-28 ENCOUNTER — Encounter (HOSPITAL_COMMUNITY): Payer: Self-pay | Admitting: Emergency Medicine

## 2013-03-28 DIAGNOSIS — I10 Essential (primary) hypertension: Secondary | ICD-10-CM | POA: Insufficient documentation

## 2013-03-28 DIAGNOSIS — S161XXA Strain of muscle, fascia and tendon at neck level, initial encounter: Secondary | ICD-10-CM

## 2013-03-28 DIAGNOSIS — Z79899 Other long term (current) drug therapy: Secondary | ICD-10-CM | POA: Insufficient documentation

## 2013-03-28 DIAGNOSIS — S139XXA Sprain of joints and ligaments of unspecified parts of neck, initial encounter: Secondary | ICD-10-CM | POA: Insufficient documentation

## 2013-03-28 DIAGNOSIS — F17200 Nicotine dependence, unspecified, uncomplicated: Secondary | ICD-10-CM | POA: Insufficient documentation

## 2013-03-28 DIAGNOSIS — E663 Overweight: Secondary | ICD-10-CM | POA: Insufficient documentation

## 2013-03-28 DIAGNOSIS — F209 Schizophrenia, unspecified: Secondary | ICD-10-CM | POA: Insufficient documentation

## 2013-03-28 DIAGNOSIS — F0781 Postconcussional syndrome: Secondary | ICD-10-CM | POA: Insufficient documentation

## 2013-03-28 DIAGNOSIS — Y92410 Unspecified street and highway as the place of occurrence of the external cause: Secondary | ICD-10-CM | POA: Insufficient documentation

## 2013-03-28 DIAGNOSIS — Y9389 Activity, other specified: Secondary | ICD-10-CM | POA: Insufficient documentation

## 2013-03-28 DIAGNOSIS — G8929 Other chronic pain: Secondary | ICD-10-CM | POA: Insufficient documentation

## 2013-03-28 DIAGNOSIS — F3130 Bipolar disorder, current episode depressed, mild or moderate severity, unspecified: Secondary | ICD-10-CM | POA: Insufficient documentation

## 2013-03-28 DIAGNOSIS — H53149 Visual discomfort, unspecified: Secondary | ICD-10-CM | POA: Insufficient documentation

## 2013-03-28 MED ORDER — OXYCODONE-ACETAMINOPHEN 5-325 MG PO TABS
1.0000 | ORAL_TABLET | Freq: Four times a day (QID) | ORAL | Status: DC | PRN
  Administered 2013-03-28: 1 via ORAL
  Filled 2013-03-28: qty 1

## 2013-03-28 MED ORDER — KETOROLAC TROMETHAMINE 30 MG/ML IJ SOLN
30.0000 mg | Freq: Once | INTRAMUSCULAR | Status: AC
  Administered 2013-03-28: 30 mg via INTRAMUSCULAR
  Filled 2013-03-28: qty 1

## 2013-03-28 MED ORDER — IBUPROFEN 600 MG PO TABS: 600.0000 mg | ORAL_TABLET | Freq: Four times a day (QID) | ORAL | Status: DC | PRN

## 2013-03-28 MED ORDER — OXYCODONE-ACETAMINOPHEN 5-325 MG PO TABS: 1.0000 | ORAL_TABLET | Freq: Four times a day (QID) | ORAL | Status: DC | PRN

## 2013-03-28 NOTE — Discharge Instructions (Signed)
Cervical Sprain °A cervical sprain is an injury in the neck in which the strong, fibrous tissues (ligaments) that connect your neck bones stretch or tear. Cervical sprains can range from mild to severe. Severe cervical sprains can cause the neck vertebrae to be unstable. This can lead to damage of the spinal cord and can result in serious nervous system problems. The amount of time it takes for a cervical sprain to get better depends on the cause and extent of the injury. Most cervical sprains heal in 1 to 3 weeks. °CAUSES  °Severe cervical sprains may be caused by:  °· Contact sport injuries (such as from football, rugby, wrestling, hockey, auto racing, gymnastics, diving, martial arts, or boxing).   °· Motor vehicle collisions.   °· Whiplash injuries. This is an injury from a sudden forward-and backward whipping movement of the head and neck.  °· Falls.   °Mild cervical sprains may be caused by:  °· Being in an awkward position, such as while cradling a telephone between your ear and shoulder.   °· Sitting in a chair that does not offer proper support.   °· Working at a poorly designed computer station.   °· Looking up or down for long periods of time.   °SYMPTOMS  °· Pain, soreness, stiffness, or a burning sensation in the front, back, or sides of the neck. This discomfort may develop immediately after the injury or slowly, 24 hours or more after the injury.   °· Pain or tenderness directly in the middle of the back of the neck.   °· Shoulder or upper back pain.   °· Limited ability to move the neck.   °· Headache.   °· Dizziness.   °· Weakness, numbness, or tingling in the hands or arms.   °· Muscle spasms.   °· Difficulty swallowing or chewing.   °· Tenderness and swelling of the neck.   °DIAGNOSIS  °Most of the time your health care provider can diagnose a cervical sprain by taking your history and doing a physical exam. Your health care provider will ask about previous neck injuries and any known neck  problems, such as arthritis in the neck. X-rays may be taken to find out if there are any other problems, such as with the bones of the neck. Other tests, such as a CT scan or MRI, may also be needed.  °TREATMENT  °Treatment depends on the severity of the cervical sprain. Mild sprains can be treated with rest, keeping the neck in place (immobilization), and pain medicines. Severe cervical sprains are immediately immobilized. Further treatment is done to help with pain, muscle spasms, and other symptoms and may include: °· Medicines, such as pain relievers, numbing medicines, or muscle relaxants.   °· Physical therapy. This may involve stretching exercises, strengthening exercises, and posture training. Exercises and improved posture can help stabilize the neck, strengthen muscles, and help stop symptoms from returning.   °HOME CARE INSTRUCTIONS  °· Put ice on the injured area.   °· Put ice in a plastic bag.   °· Place a towel between your skin and the bag.   °· Leave the ice on for 15 20 minutes, 3 4 times a day.   °· If your injury was severe, you may have been given a cervical collar to wear. A cervical collar is a two-piece collar designed to keep your neck from moving while it heals. °· Do not remove the collar unless instructed by your health care provider. °· If you have long hair, keep it outside of the collar. °· Ask your health care provider before making any adjustments to your collar.   Minor adjustments may be required over time to improve comfort and reduce pressure on your chin or on the back of your head. °· If you are allowed to remove the collar for cleaning or bathing, follow your health care provider's instructions on how to do so safely. °· Keep your collar clean by wiping it with mild soap and water and drying it completely. If the collar you have been given includes removable pads, remove them every 1 2 days and hand wash them with soap and water. Allow them to air dry. They should be completely  dry before you wear them in the collar. °· If you are allowed to remove the collar for cleaning and bathing, wash and dry the skin of your neck. Check your skin for irritation or sores. If you see any, tell your health care provider. °· Do not drive while wearing the collar.   °· Only take over-the-counter or prescription medicines for pain, discomfort, or fever as directed by your health care provider.   °· Keep all follow-up appointments as directed by your health care provider.   °· Keep all physical therapy appointments as directed by your health care provider.   °· Make any needed adjustments to your workstation to promote good posture.   °· Avoid positions and activities that make your symptoms worse.   °· Warm up and stretch before being active to help prevent problems.   °SEEK MEDICAL CARE IF:  °· Your pain is not controlled with medicine.   °· You are unable to decrease your pain medicine over time as planned.   °· Your activity level is not improving as expected.   °SEEK IMMEDIATE MEDICAL CARE IF:  °· You develop any bleeding. °· You develop stomach upset. °· You have signs of an allergic reaction to your medicine.   °· Your symptoms get worse.   °· You develop new, unexplained symptoms.   °· You have numbness, tingling, weakness, or paralysis in any part of your body.   °MAKE SURE YOU:  °· Understand these instructions. °· Will watch your condition. °· Will get help right away if you are not doing well or get worse. °Document Released: 11/14/2006 Document Revised: 11/07/2012 Document Reviewed: 07/25/2012 °[employer]® Patient Information ©2014 [employer]. ° °Concussion, Adult °A concussion, or closed-head injury, is a brain injury caused by a direct blow to the head or by a quick and sudden movement (jolt) of the head or neck. Concussions are usually not life-threatening. Even so, the effects of a concussion can be serious. If you have had a concussion before, you are more likely to experience  concussion-like symptoms after a direct blow to the head.  °CAUSES  °· Direct blow to the head, such as from running into another player during a soccer game, being hit in a fight, or hitting your head on a hard surface. °· A jolt of the head or neck that causes the brain to move back and forth inside the skull, such as in a car crash. °SIGNS AND SYMPTOMS  °The signs of a concussion can be hard to notice. Early on, they may be missed by you, family members, and health care providers. You may look fine but act or feel differently. °Symptoms are usually temporary, but they may last for days, weeks, or even longer. Some symptoms may appear right away while others may not show up for hours or days. Every head injury is different. Symptoms include:  °· Mild to moderate headaches that will not go away. °· A feeling of pressure inside your head.  °· Having more trouble   than usual:   °· Learning or remembering things you have heard. °· Answering questions.  °· Paying attention or concentrating.   °· Organizing daily tasks.   °· Making decisions and solving problems.   °· Slowness in thinking, acting or reacting, speaking, or reading.   °· Getting lost or being easily confused.   °· Feeling tired all the time or lacking energy (fatigued).   °· Feeling drowsy.   °· Sleep disturbances.   °· Sleeping more than usual.   °· Sleeping less than usual.   °· Trouble falling asleep.   °· Trouble sleeping (insomnia).   °· Loss of balance or feeling lightheaded or dizzy.   °· Nausea or vomiting.   °· Numbness or tingling.   °· Increased sensitivity to:   °· Sounds.   °· Lights.   °· Distractions.   °· Vision problems or eyes that tire easily.   °· Diminished sense of taste or smell.   °· Ringing in the ears.   °· Mood changes such as feeling sad or anxious.   °· Becoming easily irritated or angry for little or no reason.   °· Lack of motivation. °· Seeing or hearing things other people do not see or hear (hallucinations). °DIAGNOSIS    °Your health care provider can usually diagnose a concussion based on a description of your injury and symptoms. He or she will ask whether you passed out (lost consciousness) and whether you are having trouble remembering events that happened right before and during your injury.  °Your evaluation might include:  °· A brain scan to look for signs of injury to the brain. Even if the test shows no injury, you may still have a concussion.   °· Blood tests to be sure other problems are not present. °TREATMENT  °· Concussions are usually treated in an emergency department, in urgent care, or at a clinic. You may need to stay in the hospital overnight for further treatment.   °· Tell your health care provider if you are taking any medicines, including prescription medicines, over-the-counter medicines, and natural remedies. Some medicines, such as blood thinners (anticoagulants) and aspirin, may increase the chance of complications. Also tell your health care provider whether you have had alcohol or are taking illegal drugs. This information may affect treatment. °· Your health care provider will send you home with important instructions to follow. °· How fast you will recover from a concussion depends on many factors. These factors include how severe your concussion is, what part of your brain was injured, your age, and how healthy you were before the concussion. °· Most people with mild injuries recover fully. Recovery can take time. In general, recovery is slower in older persons. Also, persons who have had a concussion in the past or have other medical problems may find that it takes longer to recover from their current injury. °HOME CARE INSTRUCTIONS  °General Instructions °· Carefully follow the directions your health care provider gave you. °· Only take over-the-counter or prescription medicines for pain, discomfort, or fever as directed by your health care provider. °· Take only those medicines that your health  care provider has approved. °· Do not drink alcohol until your health care provider says you are well enough to do so. Alcohol and certain other drugs may slow your recovery and can put you at risk of further injury. °· If it is harder than usual to remember things, write them down. °· If you are easily distracted, try to do one thing at a time. For example, do not try to watch TV while fixing dinner. °· Talk with family members or close friends when making   important decisions. °· Keep all follow-up appointments. Repeated evaluation of your symptoms is recommended for your recovery. °· Watch your symptoms and tell others to do the same. Complications sometimes occur after a concussion. Older adults with a brain injury may have a higher risk of serious complications such as of a blood clot on the brain. °· Tell your teachers, school nurse, school counselor, coach, athletic trainer, or work manager about your injury, symptoms, and restrictions. Tell them about what you can or cannot do. They should watch for:   °· Increased problems with attention or concentration.   °· Increased difficulty remembering or learning new information.   °· Increased time needed to complete tasks or assignments.   °· Increased irritability or decreased ability to cope with stress.   °· Increased symptoms.   °· Rest. Rest helps the brain to heal. Make sure you: °· Get plenty of sleep at night. Avoid staying up late at night. °· Keep the same bedtime hours on weekends and weekdays. °· Rest during the day. Take daytime naps or rest breaks when you feel tired. °· Limit activities that require a lot of thought or concentration. These includes   °· Doing homework or job-related work.   °· Watching TV.   °· Working on the computer. °· Avoid any situation where there is potential for another head injury (football, hockey, soccer, basketball, martial arts, downhill snow sports and horseback riding). Your condition will get worse every time you  experience a concussion. You should avoid these activities until you are evaluated by the appropriate follow-up caregivers. °Returning To Your Regular Activities °You will need to return to your normal activities slowly, not all at once. You must give your body and brain enough time for recovery. °· Do not return to sports or other athletic activities until your health care provider tells you it is safe to do so. °· Ask your health care provider when you can drive, ride a bicycle, or operate heavy machinery. Your ability to react may be slower after a brain injury. Never do these activities if you are dizzy. °· Ask your health care provider about when you can return to work or school. °Preventing Another Concussion °It is very important to avoid another brain injury, especially before you have recovered. In rare cases, another injury can lead to permanent brain damage, brain swelling, or death. The risk of this is greatest during the first 7 10 days after a head injury. Avoid injuries by:  °· Wearing a seat belt when riding in a car.   °· Drinking alcohol only in moderation.   °· Wearing a helmet when biking, skiing, skateboarding, skating, or doing similar activities. °· Avoiding activities that could lead to a second concussion, such as contact or recreational sports, until your health care provider says it is OK. °· Taking safety measures in your home.   °· Remove clutter and tripping hazards from floors and stairways.   °· Use grab bars in bathrooms and handrails by stairs.   °· Place non-slip mats on floors and in bathtubs.   °· Improve lighting in dim areas. °SEEK MEDICAL CARE IF:  °· You have increased problems paying attention or concentrating.   °· You have increased difficulty remembering or learning new information.   °· You need more time to complete tasks or assignments than before.   °· You have increased irritability or decreased ability to cope with stress. °· You have more symptoms than  before. °Seek medical care if you have any of the following symptoms for more than 2 weeks after your injury:  °·   Lasting (chronic) headaches.   °· Dizziness or balance problems.   °· Nausea. °· Vision problems.   °· Increased sensitivity to noise or light.   °· Depression or mood swings.   °· Anxiety or irritability.   °· Memory problems.   °· Difficulty concentrating or paying attention.   °· Sleep problems.   °· Feeling tired all the time. °SEEK IMMEDIATE MEDICAL CARE IF:  °· You have severe or worsening headaches. These may be a sign of a blood clot in the brain. °· You have weakness (even if only in one hand, leg, or part of the face). °· You have numbness. °· You have decreased coordination.   °· You vomit repeatedly.  °· You have increased sleepiness. °· One pupil is larger than the other.   °· You have convulsions.   °· You have slurred speech.   °· You have increased confusion. This may be a sign of a blood clot in the brain. °· You have increased restlessness, agitation, or irritability.   °· You are unable to recognize people or places.   °· You have neck pain.   °· It is difficult to wake you up.   °· You have unusual behavior changes.   °· You lose consciousness. °MAKE SURE YOU:  °· Understand these instructions. °· Will watch your condition. °· Will get help right away if you are not doing well or get worse. °Document Released: 04/09/2003 Document Revised: 09/19/2012 Document Reviewed: 08/09/2012 °[employer]® Patient Information ©2014 [employer]. ° °
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
## 2013-05-30 ENCOUNTER — Emergency Department (HOSPITAL_COMMUNITY)
Admission: EM | Admit: 2013-05-30 | Discharge: 2013-05-31 | Disposition: A | Discharge status: Home / Self Care | Payer: No Typology Code available for payment source | Attending: Emergency Medicine | Admitting: Emergency Medicine

## 2013-05-30 ENCOUNTER — Encounter (HOSPITAL_COMMUNITY): Payer: Self-pay | Admitting: Emergency Medicine

## 2013-05-30 DIAGNOSIS — F17200 Nicotine dependence, unspecified, uncomplicated: Secondary | ICD-10-CM | POA: Insufficient documentation

## 2013-05-30 DIAGNOSIS — S335XXA Sprain of ligaments of lumbar spine, initial encounter: Secondary | ICD-10-CM | POA: Insufficient documentation

## 2013-05-30 DIAGNOSIS — F209 Schizophrenia, unspecified: Secondary | ICD-10-CM | POA: Insufficient documentation

## 2013-05-30 DIAGNOSIS — Y92410 Unspecified street and highway as the place of occurrence of the external cause: Secondary | ICD-10-CM | POA: Insufficient documentation

## 2013-05-30 DIAGNOSIS — S39012A Strain of muscle, fascia and tendon of lower back, initial encounter: Secondary | ICD-10-CM

## 2013-05-30 DIAGNOSIS — F319 Bipolar disorder, unspecified: Secondary | ICD-10-CM | POA: Insufficient documentation

## 2013-05-30 DIAGNOSIS — Z79899 Other long term (current) drug therapy: Secondary | ICD-10-CM | POA: Insufficient documentation

## 2013-05-30 DIAGNOSIS — E669 Obesity, unspecified: Secondary | ICD-10-CM | POA: Insufficient documentation

## 2013-05-30 DIAGNOSIS — G8929 Other chronic pain: Secondary | ICD-10-CM | POA: Insufficient documentation

## 2013-05-30 DIAGNOSIS — S139XXA Sprain of joints and ligaments of unspecified parts of neck, initial encounter: Secondary | ICD-10-CM | POA: Insufficient documentation

## 2013-05-30 DIAGNOSIS — S161XXA Strain of muscle, fascia and tendon at neck level, initial encounter: Secondary | ICD-10-CM

## 2013-05-30 DIAGNOSIS — Y9389 Activity, other specified: Secondary | ICD-10-CM | POA: Insufficient documentation

## 2013-05-30 DIAGNOSIS — I10 Essential (primary) hypertension: Secondary | ICD-10-CM | POA: Insufficient documentation
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
## 2013-05-31 MED ORDER — OXYCODONE HCL 5 MG PO TABS: 5.0000 mg | ORAL_TABLET | Freq: Four times a day (QID) | ORAL | Status: DC | PRN

## 2013-05-31 MED ORDER — OXYCODONE HCL 5 MG PO TABS
5.0000 mg | ORAL_TABLET | Freq: Once | ORAL | Status: AC
  Administered 2013-05-31: 5 mg via ORAL
  Filled 2013-05-31: qty 1

## 2013-05-31 NOTE — Discharge Instructions (Signed)
Cervical Strain and Sprain (Whiplash) °with Rehab °Cervical strain and sprains are injuries that commonly occur with "whiplash" injuries. Whiplash occurs when the neck is forcefully whipped backward or forward, such as during a motor vehicle accident. The muscles, ligaments, tendons, discs and nerves of the neck are susceptible to injury when this occurs. °SYMPTOMS  °· Pain or stiffness in the front and/or back of neck °· Symptoms may present immediately or up to 24 hours after injury. °· Dizziness, headache, nausea and vomiting. °· Muscle spasm with soreness and stiffness in the neck. °· Tenderness and swelling at the injury site. °CAUSES  °Whiplash injuries often occur during contact sports or motor vehicle accidents.  °RISK INCREASES WITH: °· Osteoarthritis of the spine. °· Situations that make head or neck accidents or trauma more likely. °· High-risk sports (football, rugby, wrestling, hockey, auto racing, gymnastics, diving, contact karate or boxing). °· Poor strength and flexibility of the neck. °· Previous neck injury. °· Poor tackling technique. °· Improperly fitted or padded equipment. °PREVENTION °· Learn and use proper technique (avoid tackling with the head, spearing and head-butting; use proper falling techniques to avoid landing on the head). °· Warm up and stretch properly before activity. °· Maintain physical fitness: °· Strength, flexibility and endurance. °· Cardiovascular fitness. °· Wear properly fitted and padded protective equipment, such as padded soft collars, for participation in contact sports. °PROGNOSIS  °Recovery for cervical strain and sprain injuries is dependent on the extent of the injury. These injuries are usually curable in 1 week to 3 months with appropriate treatment.  °RELATED COMPLICATIONS  °· Temporary numbness and weakness may occur if the nerve roots are damaged, and this may persist until the nerve has completely healed. °· Chronic pain due to frequent recurrence of  symptoms. °· Prolonged healing, especially if activity is resumed too soon (before complete recovery). °TREATMENT  °Treatment initially involves the use of ice and medication to help reduce pain and inflammation. It is also important to perform strengthening and stretching exercises and modify activities that worsen symptoms so the injury does not get worse. These exercises may be performed at home or with a therapist. For patients who experience severe symptoms, a soft padded collar may be recommended to be worn around the neck.  °Improving your posture may help reduce symptoms. Posture improvement includes pulling your chin and abdomen in while sitting or standing. If you are sitting, sit in a firm chair with your buttocks against the back of the chair. While sleeping, try replacing your pillow with a small towel rolled to 2 inches in diameter, or use a cervical pillow or soft cervical collar. Poor sleeping positions delay healing.  °For patients with nerve root damage, which causes numbness or weakness, the use of a cervical traction apparatus may be recommended. Surgery is rarely necessary for these injuries. However, cervical strain and sprains that are present at birth (congenital) may require surgery. °MEDICATION  °· If pain medication is necessary, nonsteroidal anti-inflammatory medications, such as aspirin and ibuprofen, or other minor pain relievers, such as acetaminophen, are often recommended. °· Do not take pain medication for 7 days before surgery. °· Prescription pain relievers may be given if deemed necessary by your caregiver. Use only as directed and only as much as you need. °HEAT AND COLD:  °· Cold treatment (icing) relieves pain and reduces inflammation. Cold treatment should be applied for 10 to 15 minutes every 2 to 3 hours for inflammation and pain and immediately after any activity that   aggravates your symptoms. Use ice packs or an ice massage. °· Heat treatment may be used prior to  performing the stretching and strengthening activities prescribed by your caregiver, physical therapist, or athletic trainer. Use a heat pack or a warm soak. °SEEK MEDICAL CARE IF:  °· Symptoms get worse or do not improve in 2 weeks despite treatment. °· New, unexplained symptoms develop (drugs used in treatment may produce side effects). °EXERCISES °RANGE OF MOTION (ROM) AND STRETCHING EXERCISES - Cervical Strain and Sprain °These exercises may help you when beginning to rehabilitate your injury. In order to successfully resolve your symptoms, you must improve your posture. These exercises are designed to help reduce the forward-head and rounded-shoulder posture which contributes to this condition. Your symptoms may resolve with or without further involvement from your physician, physical therapist or athletic trainer. While completing these exercises, remember:  °· Restoring tissue flexibility helps normal motion to return to the joints. This allows healthier, less painful movement and activity. °· An effective stretch should be held for at least 20 seconds, although you may need to begin with shorter hold times for comfort. °· A stretch should never be painful. You should only feel a gentle lengthening or release in the stretched tissue. °STRETCH- Axial Extensors °· Lie on your back on the floor. You may bend your knees for comfort. Place a rolled up hand towel or dish towel, about 2 inches in diameter, under the part of your head that makes contact with the floor. °· Gently tuck your chin, as if trying to make a "double chin," until you feel a gentle stretch at the base of your head. °· Hold __________ seconds. °Repeat __________ times. Complete this exercise __________ times per day.  °STRETECH - Axial Extension  °· Stand or sit on a firm surface. Assume a good posture: chest up, shoulders drawn back, abdominal muscles slightly tense, knees unlocked (if standing) and feet hip width apart. °· Slowly retract your  chin so your head slides back and your chin slightly lowers.Continue to look straight ahead. °· You should feel a gentle stretch in the back of your head. Be certain not to feel an aggressive stretch since this can cause headaches later. °· Hold for __________ seconds. °Repeat __________ times. Complete this exercise __________ times per day. °STRETCH  Cervical Side Bend  °· Stand or sit on a firm surface. Assume a good posture: chest up, shoulders drawn back, abdominal muscles slightly tense, knees unlocked (if standing) and feet hip width apart. °· Without letting your nose or shoulders move, slowly tip your right / left ear to your shoulder until your feel a gentle stretch in the muscles on the opposite side of your neck. °· Hold __________ seconds. °Repeat __________ times. Complete this exercise __________ times per day. °STRETCH  Cervical Rotators  °· Stand or sit on a firm surface. Assume a good posture: chest up, shoulders drawn back, abdominal muscles slightly tense, knees unlocked (if standing) and feet hip width apart. °· Keeping your eyes level with the ground, slowly turn your head until you feel a gentle stretch along the back and opposite side of your neck. °· Hold __________ seconds. °Repeat __________ times. Complete this exercise __________ times per day. °RANGE OF MOTION - Neck Circles  °· Stand or sit on a firm surface. Assume a good posture: chest up, shoulders drawn back, abdominal muscles slightly tense, knees unlocked (if standing) and feet hip width apart. °· Gently roll your head down and around from   the back of one shoulder to the back of the other. The motion should never be forced or painful. °· Repeat the motion 10-20 times, or until you feel the neck muscles relax and loosen. °Repeat __________ times. Complete the exercise __________ times per day. °STRENGTHENING EXERCISES - Cervical Strain and Sprain °These exercises may help you when beginning to rehabilitate your injury. They may  resolve your symptoms with or without further involvement from your physician, physical therapist or athletic trainer. While completing these exercises, remember:  °· Muscles can gain both the endurance and the strength needed for everyday activities through controlled exercises. °· Complete these exercises as instructed by your physician, physical therapist or athletic trainer. Progress the resistance and repetitions only as guided. °· You may experience muscle soreness or fatigue, but the pain or discomfort you are trying to eliminate should never worsen during these exercises. If this pain does worsen, stop and make certain you are following the directions exactly. If the pain is still present after adjustments, discontinue the exercise until you can discuss the trouble with your clinician. °STRENGTH Cervical Flexors, Isometric °· Face a wall, standing about 6 inches away. Place a small pillow, a ball about 6-8 inches in diameter, or a folded towel between your forehead and the wall. °· Slightly tuck your chin and gently push your forehead into the soft object. Push only with mild to moderate intensity, building up tension gradually. Keep your jaw and forehead relaxed. °· Hold 10 to 20 seconds. Keep your breathing relaxed. °· Release the tension slowly. Relax your neck muscles completely before you start the next repetition. °Repeat __________ times. Complete this exercise __________ times per day. °STRENGTH- Cervical Lateral Flexors, Isometric  °· Stand about 6 inches away from a wall. Place a small pillow, a ball about 6-8 inches in diameter, or a folded towel between the side of your head and the wall. °· Slightly tuck your chin and gently tilt your head into the soft object. Push only with mild to moderate intensity, building up tension gradually. Keep your jaw and forehead relaxed. °· Hold 10 to 20 seconds. Keep your breathing relaxed. °· Release the tension slowly. Relax your neck muscles completely before  you start the next repetition. °Repeat __________ times. Complete this exercise __________ times per day. °STRENGTH  Cervical Extensors, Isometric  °· Stand about 6 inches away from a wall. Place a small pillow, a ball about 6-8 inches in diameter, or a folded towel between the back of your head and the wall. °· Slightly tuck your chin and gently tilt your head back into the soft object. Push only with mild to moderate intensity, building up tension gradually. Keep your jaw and forehead relaxed. °· Hold 10 to 20 seconds. Keep your breathing relaxed. °· Release the tension slowly. Relax your neck muscles completely before you start the next repetition. °Repeat __________ times. Complete this exercise __________ times per day. °POSTURE AND BODY MECHANICS CONSIDERATIONS - Cervical Strain and Sprain °Keeping correct posture when sitting, standing or completing your activities will reduce the stress put on different body tissues, allowing injured tissues a chance to heal and limiting painful experiences. The following are general guidelines for improved posture. Your physician or physical therapist will provide you with any instructions specific to your needs. While reading these guidelines, remember: °· The exercises prescribed by your provider will help you have the flexibility and strength to maintain correct postures. °· The correct posture provides the optimal environment for your joints to   work. All of your joints have less wear and tear when properly supported by a spine with good posture. This means you will experience a healthier, less painful body. °· Correct posture must be practiced with all of your activities, especially prolonged sitting and standing. Correct posture is as important when doing repetitive low-stress activities (typing) as it is when doing a single heavy-load activity (lifting). °PROLONGED STANDING WHILE SLIGHTLY LEANING FORWARD °When completing a task that requires you to lean forward while  standing in one place for a long time, place either foot up on a stationary 2-4 inch high object to help maintain the best posture. When both feet are on the ground, the low back tends to lose its slight inward curve. If this curve flattens (or becomes too large), then the back and your other joints will experience too much stress, fatigue more quickly and can cause pain.  °RESTING POSITIONS °Consider which positions are most painful for you when choosing a resting position. If you have pain with flexion-based activities (sitting, bending, stooping, squatting), choose a position that allows you to rest in a less flexed posture. You would want to avoid curling into a fetal position on your side. If your pain worsens with extension-based activities (prolonged standing, working overhead), avoid resting in an extended position such as sleeping on your stomach. Most people will find more comfort when they rest with their spine in a more neutral position, neither too rounded nor too arched. Lying on a non-sagging bed on your side with a pillow between your knees, or on your back with a pillow under your knees will often provide some relief. Keep in mind, being in any one position for a prolonged period of time, no matter how correct your posture, can still lead to stiffness. °WALKING °Walk with an upright posture. Your ears, shoulders and hips should all line-up. °OFFICE WORK °When working at a desk, create an environment that supports good, upright posture. Without extra support, muscles fatigue and lead to excessive strain on joints and other tissues. °CHAIR: °· A chair should be able to slide under your desk when your back makes contact with the back of the chair. This allows you to work closely. °· The chair's height should allow your eyes to be level with the upper part of your monitor and your hands to be slightly lower than your elbows. °· Body position: °· Your feet should make contact with the floor. If this is  not possible, use a foot rest. °· Keep your ears over your shoulders. This will reduce stress on your neck and low back. °Document Released: 01/17/2005 Document Revised: 05/14/2012 Document Reviewed: 05/01/2008 °[employer]® Patient Information ©2014 [employer]. °Motor Vehicle Collision  °It is common to have multiple bruises and sore muscles after a motor vehicle collision (MVC). These tend to feel worse for the first 24 hours. You may have the most stiffness and soreness over the first several hours. You may also feel worse when you wake up the first morning after your collision. After this point, you will usually begin to improve with each day. The speed of improvement often depends on the severity of the collision, the number of injuries, and the location and nature of these injuries. °HOME CARE INSTRUCTIONS  °· Put ice on the injured area. °· Put ice in a plastic bag. °· Place a towel between your skin and the bag. °· Leave the ice on for 15-20 minutes, 03-04 times a day. °· Drink enough fluids to   keep your urine clear or pale yellow. Do not drink alcohol. °· Take a warm shower or bath once or twice a day. This will increase blood flow to sore muscles. °· You may return to activities as directed by your caregiver. Be careful when lifting, as this may aggravate neck or back pain. °· Only take over-the-counter or prescription medicines for pain, discomfort, or fever as directed by your caregiver. Do not use aspirin. This may increase bruising and bleeding. °SEEK IMMEDIATE MEDICAL CARE IF: °· You have numbness, tingling, or weakness in the arms or legs. °· You develop severe headaches not relieved with medicine. °· You have severe neck pain, especially tenderness in the middle of the back of your neck. °· You have changes in bowel or bladder control. °· There is increasing pain in any area of the body. °· You have shortness of breath, lightheadedness, dizziness, or fainting. °· You have chest pain. °· You feel  sick to your stomach (nauseous), throw up (vomit), or sweat. °· You have increasing abdominal discomfort. °· There is blood in your urine, stool, or vomit. °· You have pain in your shoulder (shoulder strap areas). °· You feel your symptoms are getting worse. °MAKE SURE YOU:  °· Understand these instructions. °· Will watch your condition. °· Will get help right away if you are not doing well or get worse. °Document Released: 01/17/2005 Document Revised: 04/11/2011 Document Reviewed: 06/16/2010 °[employer]® Patient Information ©2014 [employer]. ° °

## 2013-05-31 NOTE — ED Provider Notes (Signed)
Medical screening examination/treatment/procedure(s) were performed by non-physician practitioner and as supervising physician I was immediately available for consultation/collaboration.   EKG Interpretation None       Jillian Warth M Myrtice Lowdermilk, MD 05/31/13 0729 
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
## 2013-06-07 ENCOUNTER — Encounter (HOSPITAL_COMMUNITY): Payer: Self-pay | Admitting: Emergency Medicine

## 2013-06-07 ENCOUNTER — Emergency Department (HOSPITAL_COMMUNITY): Payer: No Typology Code available for payment source

## 2013-06-07 ENCOUNTER — Emergency Department (HOSPITAL_COMMUNITY)
Admission: EM | Admit: 2013-06-07 | Discharge: 2013-06-07 | Disposition: A | Discharge status: Home / Self Care | Payer: No Typology Code available for payment source | Attending: Emergency Medicine | Admitting: Emergency Medicine

## 2013-06-07 DIAGNOSIS — Y929 Unspecified place or not applicable: Secondary | ICD-10-CM | POA: Diagnosis not present

## 2013-06-07 DIAGNOSIS — I10 Essential (primary) hypertension: Secondary | ICD-10-CM | POA: Diagnosis not present

## 2013-06-07 DIAGNOSIS — G8929 Other chronic pain: Secondary | ICD-10-CM | POA: Insufficient documentation

## 2013-06-07 DIAGNOSIS — F17200 Nicotine dependence, unspecified, uncomplicated: Secondary | ICD-10-CM | POA: Insufficient documentation

## 2013-06-07 DIAGNOSIS — Y9389 Activity, other specified: Secondary | ICD-10-CM | POA: Diagnosis not present

## 2013-06-07 DIAGNOSIS — F319 Bipolar disorder, unspecified: Secondary | ICD-10-CM | POA: Insufficient documentation

## 2013-06-07 DIAGNOSIS — Z79899 Other long term (current) drug therapy: Secondary | ICD-10-CM | POA: Insufficient documentation

## 2013-06-07 DIAGNOSIS — S39012A Strain of muscle, fascia and tendon of lower back, initial encounter: Secondary | ICD-10-CM

## 2013-06-07 DIAGNOSIS — S335XXA Sprain of ligaments of lumbar spine, initial encounter: Secondary | ICD-10-CM | POA: Diagnosis not present

## 2013-06-07 DIAGNOSIS — X500XXA Overexertion from strenuous movement or load, initial encounter: Secondary | ICD-10-CM | POA: Insufficient documentation

## 2013-06-07 DIAGNOSIS — F209 Schizophrenia, unspecified: Secondary | ICD-10-CM | POA: Diagnosis not present

## 2013-06-07 DIAGNOSIS — E669 Obesity, unspecified: Secondary | ICD-10-CM | POA: Diagnosis not present

## 2013-06-07 DIAGNOSIS — IMO0002 Reserved for concepts with insufficient information to code with codable children: Secondary | ICD-10-CM | POA: Diagnosis present

## 2013-06-07 MED ORDER — OXYCODONE-ACETAMINOPHEN 5-325 MG PO TABS: 1.0000 | ORAL_TABLET | Freq: Four times a day (QID) | ORAL | Status: DC | PRN

## 2013-06-07 MED ORDER — KETOROLAC TROMETHAMINE 60 MG/2ML IM SOLN
60.0000 mg | Freq: Once | INTRAMUSCULAR | Status: AC
  Administered 2013-06-07: 60 mg via INTRAMUSCULAR
  Filled 2013-06-07: qty 2

## 2013-06-07 MED ORDER — OXYCODONE-ACETAMINOPHEN 5-325 MG PO TABS
1.0000 | ORAL_TABLET | Freq: Once | ORAL | Status: AC
  Administered 2013-06-07: 1 via ORAL
  Filled 2013-06-07: qty 1

## 2013-06-07 MED ORDER — IBUPROFEN 600 MG PO TABS: 600.0000 mg | ORAL_TABLET | Freq: Four times a day (QID) | ORAL | Status: DC | PRN

## 2013-06-07 MED ORDER — DIAZEPAM 5 MG PO TABS: 5.0000 mg | ORAL_TABLET | Freq: Four times a day (QID) | ORAL | Status: DC | PRN
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
## 2013-06-07 NOTE — Discharge Instructions (Signed)
Lumbosacral Strain Lumbosacral strain is a strain of any of the parts that make up your lumbosacral vertebrae. Your lumbosacral vertebrae are the bones that make up the lower third of your backbone. Your lumbosacral vertebrae are held together by muscles and tough, fibrous tissue (ligaments).  CAUSES  A sudden blow to your back can cause lumbosacral strain. Also, anything that causes an excessive stretch of the muscles in the low back can cause this strain. This is typically seen when people exert themselves strenuously, fall, lift heavy objects, bend, or crouch repeatedly. RISK FACTORS  Physically demanding work.  Participation in pushing or pulling sports or sports that require sudden twist of the back (tennis, golf, baseball).  Weight lifting.  Excessive lower back curvature.  Forward-tilted pelvis.  Weak back or abdominal muscles or both.  Tight hamstrings. SIGNS AND SYMPTOMS  Lumbosacral strain may cause pain in the area of your injury or pain that moves (radiates) down your leg.  DIAGNOSIS Your health care provider can often diagnose lumbosacral strain through a physical exam. In some cases, you may need tests such as X-ray exams.  TREATMENT  Treatment for your lower back injury depends on many factors that your clinician will have to evaluate. However, most treatment will include the use of anti-inflammatory medicines. HOME CARE INSTRUCTIONS   Avoid hard physical activities (tennis, racquetball, waterskiing) if you are not in proper physical condition for it. This may aggravate or create problems.  If you have a back problem, avoid sports requiring sudden body movements. Swimming and walking are generally safer activities.  Maintain good posture.  Maintain a healthy weight.  For acute conditions, you may put ice on the injured area.  Put ice in a plastic bag.  Place a towel between your skin and the bag.  Leave the ice on for 20 minutes, 2 3 times a day.  When the  low back starts healing, stretching and strengthening exercises may be recommended. SEEK MEDICAL CARE IF:  Your back pain is getting worse.  You experience severe back pain not relieved with medicines. SEEK IMMEDIATE MEDICAL CARE IF:   You have numbness, tingling, weakness, or problems with the use of your arms or legs.  There is a change in bowel or bladder control.  You have increasing pain in any area of the body, including your belly (abdomen).  You notice shortness of breath, dizziness, or feel faint.  You feel sick to your stomach (nauseous), are throwing up (vomiting), or become sweaty.  You notice discoloration of your toes or legs, or your feet get very cold. MAKE SURE YOU:   Understand these instructions.  Will watch your condition.  Will get help right away if you are not doing well or get worse. Document Released: 10/27/2004 Document Revised: 11/07/2012 Document Reviewed: 09/05/2012 Exit[employer] Patient Information 2014 [employer].  

## 2013-06-09 ENCOUNTER — Emergency Department (HOSPITAL_COMMUNITY)
Admission: EM | Admit: 2013-06-09 | Discharge: 2013-06-09 | Disposition: A | Discharge status: Home / Self Care | Payer: No Typology Code available for payment source | Attending: Emergency Medicine | Admitting: Emergency Medicine

## 2013-06-09 ENCOUNTER — Encounter (HOSPITAL_COMMUNITY): Payer: Self-pay | Admitting: Emergency Medicine

## 2013-06-09 DIAGNOSIS — L03039 Cellulitis of unspecified toe: Principal | ICD-10-CM | POA: Insufficient documentation

## 2013-06-09 DIAGNOSIS — M549 Dorsalgia, unspecified: Secondary | ICD-10-CM | POA: Insufficient documentation

## 2013-06-09 DIAGNOSIS — Z5900 Homelessness unspecified: Secondary | ICD-10-CM | POA: Insufficient documentation

## 2013-06-09 DIAGNOSIS — G8929 Other chronic pain: Secondary | ICD-10-CM | POA: Insufficient documentation

## 2013-06-09 DIAGNOSIS — I10 Essential (primary) hypertension: Secondary | ICD-10-CM | POA: Insufficient documentation

## 2013-06-09 DIAGNOSIS — Z79899 Other long term (current) drug therapy: Secondary | ICD-10-CM | POA: Insufficient documentation

## 2013-06-09 DIAGNOSIS — L03032 Cellulitis of left toe: Secondary | ICD-10-CM

## 2013-06-09 DIAGNOSIS — L02619 Cutaneous abscess of unspecified foot: Secondary | ICD-10-CM | POA: Insufficient documentation

## 2013-06-09 DIAGNOSIS — F319 Bipolar disorder, unspecified: Secondary | ICD-10-CM | POA: Insufficient documentation

## 2013-06-09 DIAGNOSIS — E669 Obesity, unspecified: Secondary | ICD-10-CM | POA: Insufficient documentation

## 2013-06-09 DIAGNOSIS — F209 Schizophrenia, unspecified: Secondary | ICD-10-CM | POA: Insufficient documentation

## 2013-06-09 DIAGNOSIS — F17200 Nicotine dependence, unspecified, uncomplicated: Secondary | ICD-10-CM | POA: Insufficient documentation

## 2013-06-09 MED ORDER — OXYCODONE HCL 5 MG PO TABS: 5.0000 mg | ORAL_TABLET | Freq: Four times a day (QID) | ORAL | Status: DC | PRN

## 2013-06-09 MED ORDER — IBUPROFEN 800 MG PO TABS
800.0000 mg | ORAL_TABLET | Freq: Once | ORAL | Status: AC
  Administered 2013-06-09: 800 mg via ORAL
  Filled 2013-06-09: qty 1

## 2013-06-09 MED ORDER — CEPHALEXIN 500 MG PO CAPS: 500.0000 mg | ORAL_CAPSULE | Freq: Four times a day (QID) | ORAL | Status: DC

## 2013-06-09 MED ORDER — OXYCODONE HCL 5 MG PO TABS
5.0000 mg | ORAL_TABLET | Freq: Once | ORAL | Status: AC
  Administered 2013-06-09: 5 mg via ORAL
  Filled 2013-06-09: qty 1

## 2013-06-09 MED ORDER — SULFAMETHOXAZOLE-TMP DS 800-160 MG PO TABS: 1.0000 | ORAL_TABLET | Freq: Two times a day (BID) | ORAL | Status: DC

## 2013-06-09 NOTE — ED Provider Notes (Signed)
Medical screening examination/treatment/procedure(s) were performed by non-physician practitioner and as supervising physician I was immediately available for consultation/collaboration.   Ilina Xu, MD 06/09/13 2254 

## 2013-06-09 NOTE — ED Provider Notes (Signed)
Medical screening examination/treatment/procedure(s) were performed by non-physician practitioner and as supervising physician I was immediately available for consultation/collaboration.   Dione Boozeavid Miko Sirico, MD 06/09/13 2255

## 2013-06-09 NOTE — ED Notes (Signed)
PA at bedside.
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
## 2013-06-09 NOTE — Discharge Instructions (Signed)
Take oxycodone as needed for severe pain. Do not drive while taking as it may cause drowsiness.   Be sure to take all of your antibiotics as prescribed. Seek medical attention if toe becomes more swollen, painful, draining pus, or you develop a fever. See below for further instructions.  Cellulitis Cellulitis is an infection of the skin and the tissue under the skin. The infected area is usually red and tender. This happens most often in the arms and lower legs. HOME CARE   Take your antibiotic medicine as told. Finish the medicine even if you start to feel better.  Keep the infected arm or leg raised (elevated).  Put a warm cloth on the area up to 4 times per day.  Only take medicines as told by your doctor.  Keep all doctor visits as told. GET HELP RIGHT AWAY IF:   You have a fever.  You feel very sleepy.  You throw up (vomit) or have watery poop (diarrhea).  You feel sick and have muscle aches and pains.  You see red streaks on the skin coming from the infected area.  Your red area gets bigger or turns a dark color.  Your bone or joint under the infected area is painful after the skin heals.  Your infection comes back in the same area or different area.  You have a puffy (swollen) bump in the infected area.  You have new symptoms. MAKE SURE YOU:   Understand these instructions.  Will watch your condition.  Will get help right away if you are not doing well or get worse. Document Released: 07/06/2007 Document Revised: 07/19/2011 Document Reviewed: 04/04/2011 [facility] Patient Information 2014 [place] [place].

## 2013-06-09 NOTE — ED Notes (Signed)
Patient presents stating he has a spider bite on the second toe of his left foot.

## 2013-06-09 NOTE — ED Provider Notes (Signed)
CSN: [id number]     Arrival date & time 06/09/13  0556 History   First MD Initiated Contact with Patient 06/09/13 [phone]     Chief Complaint  Patient presents with  . Insect Bite     (Consider location/radiation/quality/duration/timing/severity/associated sxs/prior Treatment) HPI Pt is a 38yo male c/o gradually worsening left 4th toe pain that started 2-3 days ago.  Pt states he leaves his shoes outside and is concerned he may have been bitten by a spider.  Report constant, aching burning pain to his toe, 10/10 at worst, worst with ambulation. Pt has taken oxycodone for which he takes for his back pain that providers moderate relief. 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  Denies trauma to foot. Denies hx of diabetes.   Denies fever, n/v/d.  Pt also mentions he is homeless and it is hard to keep his feet clean and dry.    Past Medical History  Diagnosis Date  . Obesity   . Hypertension   . Shortness of breath   . Sleep apnea   . Schizophrenia   . Bipolar 1 disorder   . Depression   . Chronic back pain    Past Surgical History  Procedure Laterality Date  . Cholecystectomy     Family History  Problem Relation Age of Onset  . Anesthesia problems Mother    History  Substance Use Topics  . Smoking status: Current Every Day Smoker -- 0.25 packs/day for 22 years    Types: Cigarettes  . Smokeless tobacco: Not on file  . Alcohol Use: Yes     Comment: OCCASIONAL    Review of Systems  Constitutional: Negative for fever and chills.  Gastrointestinal: Negative for nausea and vomiting.  Musculoskeletal: Positive for arthralgias and joint swelling.       Left 4th toe  Skin: Positive for color change and wound.  All other systems reviewed and are negative.     Allergies  Hydrocodone; Tramadol; and Tylenol  Home Medications   Prior to Admission medications   Medication Sig Start Date End Date  Taking? Authorizing Provider  ARIPiprazole (ABILIFY) 20 MG tablet Take 20 mg by mouth daily.   Yes Historical Provider, MD  diazepam (VALIUM) 5 MG tablet Take 1 tablet (5 mg total) by mouth every 6 (six) hours as needed for muscle spasms. 06/07/13  Yes Juliet RudeNathan R. Pickering, MD  divalproex (DEPAKOTE) 500 MG DR tablet Take 500 mg by mouth 2 (two) times daily.   Yes Historical Provider, MD  oxyCODONE (ROXICODONE) 5 MG immediate release tablet Take 1 tablet (5 mg total) by mouth every 6 (six) hours as needed for severe pain. 05/31/13  Yes Roxy Horsemanobert Browning, PA-C  oxyCODONE-acetaminophen (PERCOCET/ROXICET) 5-325 MG per tablet Take 1-2 tablets by mouth every 6 (six) hours as needed for severe pain. 06/07/13  Yes Juliet RudeNathan R. Pickering, MD  sertraline (ZOLOFT) 25 MG tablet Take 25 mg by mouth at bedtime.   Yes Historical Provider, MD  ibuprofen (ADVIL,MOTRIN) 600 MG tablet Take 1 tablet (600 mg total) by mouth every 6 (six) hours as needed. 06/07/13   Juliet RudeNathan R. Pickering, MD   BP 183/92  Pulse 68  Temp(Src) 98.5 F (36.9 C) (Oral)  Resp 18  Ht 6' (1.829 m)  Wt 300 lb (136.079 kg)  BMI 40.68 kg/m2  SpO2 94% Physical Exam  Nursing note and vitals reviewed. Constitutional: He is oriented to  person, place, and time. He appears well-developed and well-nourished.  HENT:  Head: Normocephalic and atraumatic.  Eyes: EOM are normal.  Neck: Normal range of motion.  Cardiovascular: Normal rate.   Cap refill left 4th toe <3 seconds.  Pulmonary/Chest: Effort normal.  Musculoskeletal: Normal range of motion. He exhibits edema and tenderness.  Mild edema and erythema with tenderness of left 4th toe.  Neurological: He is alert and oriented to person, place, and time.  Skin: Skin is warm and dry. There is erythema.  Erythema and mild edema of left 4th toe.  No active discharge.  Skin in tact. No underlying abscess. No nailbed involvement. Callused thick skin on plantar aspect of left foot and toes including left 4th toe.    Psychiatric: He has a normal mood and affect. His behavior is normal.    ED Course  Procedures (including critical care time) Labs Review Labs Reviewed - No data to display    EKG Interpretation None      MDM   Final diagnoses:  Cellulitis of fourth toe, left    Pt presenting with posible insect bite to left 4th toe.  Cellulitis present w/o abscess, skin appears in tact w/o induration or discharge.  Toe is neurovascularly in tact.  Will tx with bactrim and keflex as pt states he is homeless, unable to soak his feet or use warm compresses.  Pt does not have PCP.  [facility] as take all antibiotics as prescribed. Return precautions provided. Pt verbalized understanding and agreement with tx plan.     Junius FinnerErin O'Malley, PA-C 06/09/13 [phone]

## 2013-06-12 ENCOUNTER — Emergency Department (HOSPITAL_COMMUNITY)
Admission: EM | Admit: 2013-06-12 | Discharge: 2013-06-12 | Disposition: A | Discharge status: Home / Self Care | Payer: No Typology Code available for payment source | Attending: Emergency Medicine | Admitting: Emergency Medicine

## 2013-06-12 ENCOUNTER — Encounter (HOSPITAL_COMMUNITY): Payer: Self-pay | Admitting: Emergency Medicine

## 2013-06-12 DIAGNOSIS — M546 Pain in thoracic spine: Secondary | ICD-10-CM | POA: Insufficient documentation

## 2013-06-12 DIAGNOSIS — Z8659 Personal history of other mental and behavioral disorders: Secondary | ICD-10-CM | POA: Insufficient documentation

## 2013-06-12 DIAGNOSIS — I10 Essential (primary) hypertension: Secondary | ICD-10-CM | POA: Insufficient documentation

## 2013-06-12 DIAGNOSIS — Z792 Long term (current) use of antibiotics: Secondary | ICD-10-CM | POA: Insufficient documentation

## 2013-06-12 DIAGNOSIS — F319 Bipolar disorder, unspecified: Secondary | ICD-10-CM | POA: Insufficient documentation

## 2013-06-12 DIAGNOSIS — M549 Dorsalgia, unspecified: Secondary | ICD-10-CM

## 2013-06-12 DIAGNOSIS — E669 Obesity, unspecified: Secondary | ICD-10-CM | POA: Insufficient documentation

## 2013-06-12 DIAGNOSIS — G8911 Acute pain due to trauma: Secondary | ICD-10-CM | POA: Insufficient documentation

## 2013-06-12 DIAGNOSIS — Z79899 Other long term (current) drug therapy: Secondary | ICD-10-CM | POA: Insufficient documentation

## 2013-06-12 DIAGNOSIS — F17200 Nicotine dependence, unspecified, uncomplicated: Secondary | ICD-10-CM | POA: Insufficient documentation

## 2013-06-12 DIAGNOSIS — G8929 Other chronic pain: Secondary | ICD-10-CM | POA: Insufficient documentation

## 2013-06-12 MED ORDER — IBUPROFEN 400 MG PO TABS
800.0000 mg | ORAL_TABLET | Freq: Once | ORAL | Status: AC
  Administered 2013-06-12: 800 mg via ORAL
  Filled 2013-06-12: qty 2
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
## 2013-06-12 NOTE — Discharge Instructions (Signed)
Follow up with Dr. Danielle DessElsner as instructed at your previous visits. Refer to attached documents for more information
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
## 2013-06-12 NOTE — ED Notes (Signed)
Patient didn't want to wait for vitals.

## 2013-06-18 NOTE — ED Provider Notes (Signed)
Medical screening examination/treatment/procedure(s) were performed by non-physician practitioner and as supervising physician I was immediately available for consultation/collaboration.   EKG Interpretation None        Deitra Craine, MD 06/18/13 1545 

## 2013-07-10 ENCOUNTER — Emergency Department (HOSPITAL_COMMUNITY)
Admission: EM | Admit: 2013-07-10 | Discharge: 2013-07-10 | Disposition: A | Discharge status: Home / Self Care | Payer: No Typology Code available for payment source | Attending: Emergency Medicine | Admitting: Emergency Medicine

## 2013-07-10 ENCOUNTER — Encounter (HOSPITAL_COMMUNITY): Payer: Self-pay | Admitting: Emergency Medicine

## 2013-07-10 ENCOUNTER — Emergency Department (HOSPITAL_COMMUNITY): Payer: No Typology Code available for payment source

## 2013-07-10 DIAGNOSIS — E669 Obesity, unspecified: Secondary | ICD-10-CM | POA: Insufficient documentation

## 2013-07-10 DIAGNOSIS — Z23 Encounter for immunization: Secondary | ICD-10-CM | POA: Insufficient documentation

## 2013-07-10 DIAGNOSIS — F17200 Nicotine dependence, unspecified, uncomplicated: Secondary | ICD-10-CM | POA: Insufficient documentation

## 2013-07-10 DIAGNOSIS — Z79899 Other long term (current) drug therapy: Secondary | ICD-10-CM | POA: Insufficient documentation

## 2013-07-10 DIAGNOSIS — S0990XA Unspecified injury of head, initial encounter: Secondary | ICD-10-CM | POA: Insufficient documentation

## 2013-07-10 DIAGNOSIS — I10 Essential (primary) hypertension: Secondary | ICD-10-CM | POA: Insufficient documentation

## 2013-07-10 DIAGNOSIS — Z9089 Acquired absence of other organs: Secondary | ICD-10-CM | POA: Insufficient documentation

## 2013-07-10 DIAGNOSIS — S199XXA Unspecified injury of neck, initial encounter: Secondary | ICD-10-CM

## 2013-07-10 DIAGNOSIS — F209 Schizophrenia, unspecified: Secondary | ICD-10-CM | POA: Insufficient documentation

## 2013-07-10 DIAGNOSIS — E876 Hypokalemia: Secondary | ICD-10-CM | POA: Insufficient documentation

## 2013-07-10 DIAGNOSIS — T2102XA Burn of unspecified degree of abdominal wall, initial encounter: Secondary | ICD-10-CM | POA: Insufficient documentation

## 2013-07-10 DIAGNOSIS — T2104XA Burn of unspecified degree of lower back, initial encounter: Secondary | ICD-10-CM | POA: Insufficient documentation

## 2013-07-10 DIAGNOSIS — K6289 Other specified diseases of anus and rectum: Secondary | ICD-10-CM | POA: Insufficient documentation

## 2013-07-10 DIAGNOSIS — G8929 Other chronic pain: Secondary | ICD-10-CM | POA: Insufficient documentation

## 2013-07-10 DIAGNOSIS — S0993XA Unspecified injury of face, initial encounter: Secondary | ICD-10-CM | POA: Insufficient documentation

## 2013-07-10 DIAGNOSIS — F319 Bipolar disorder, unspecified: Secondary | ICD-10-CM | POA: Insufficient documentation

## 2013-07-10 LAB — COMPREHENSIVE METABOLIC PANEL
ALT: 30 U/L (ref 0–53)
AST: 59 U/L — AB (ref 0–37)
Albumin: 3.6 g/dL (ref 3.5–5.2)
Alkaline Phosphatase: 80 U/L (ref 39–117)
BILIRUBIN TOTAL: 0.4 mg/dL (ref 0.3–1.2)
BUN: 13 mg/dL (ref 6–23)
CALCIUM: 8.9 mg/dL (ref 8.4–10.5)
CHLORIDE: 96 meq/L (ref 96–112)
CO2: 27 mEq/L (ref 19–32)
Creatinine, Ser: 1.61 mg/dL — ABNORMAL HIGH (ref 0.50–1.35)
GFR calc Af Amer: 62 mL/min — ABNORMAL LOW (ref >90)
GFR, EST NON AFRICAN AMERICAN: 53 mL/min — AB (ref >90)
Glucose, Bld: 128 mg/dL — ABNORMAL HIGH (ref 70–99)
Potassium: 2.6 mEq/L — CL (ref 3.7–5.3)
SODIUM: 140 meq/L (ref 137–147)
Total Protein: 7 g/dL (ref 6.0–8.3)

## 2013-07-10 LAB — CBC WITH DIFFERENTIAL/PLATELET
BASOS ABS: 0 10*3/uL (ref 0.0–0.1)
Basophils Relative: 0 % (ref 0–1)
Eosinophils Absolute: 0.2 10*3/uL (ref 0.0–0.7)
Eosinophils Relative: 2 % (ref 0–5)
HCT: 39.3 % (ref 39.0–52.0)
Hemoglobin: 13.5 g/dL (ref 13.0–17.0)
Lymphocytes Relative: 30 % (ref 12–46)
Lymphs Abs: 3.2 10*3/uL (ref 0.7–4.0)
MCH: 29.9 pg (ref 26.0–34.0)
MCHC: 34.4 g/dL (ref 30.0–36.0)
MCV: 86.9 fL (ref 78.0–100.0)
Monocytes Absolute: 0.7 10*3/uL (ref 0.1–1.0)
Monocytes Relative: 6 % (ref 3–12)
NEUTROS ABS: 6.5 10*3/uL (ref 1.7–7.7)
Neutrophils Relative %: 62 % (ref 43–77)
PLATELETS: 289 10*3/uL (ref 150–400)
RBC: 4.52 MIL/uL (ref 4.22–5.81)
RDW: 12.6 % (ref 11.5–15.5)
WBC: 10.5 10*3/uL (ref 4.0–10.5)

## 2013-07-10 LAB — MAGNESIUM: Magnesium: 2.1 mg/dL (ref 1.5–2.5)

## 2013-07-10 MED ORDER — SODIUM CHLORIDE 0.9 % IV BOLUS (SEPSIS): 1000.0000 mL | Freq: Once | INTRAVENOUS | Status: DC

## 2013-07-10 MED ORDER — KETOROLAC TROMETHAMINE 60 MG/2ML IM SOLN
60.0000 mg | Freq: Once | INTRAMUSCULAR | Status: AC
  Administered 2013-07-10: 60 mg via INTRAMUSCULAR
  Filled 2013-07-10: qty 2

## 2013-07-10 MED ORDER — POTASSIUM CHLORIDE 10 MEQ/100ML IV SOLN: 10.0000 meq | Freq: Once | INTRAVENOUS | Status: DC

## 2013-07-10 MED ORDER — OXYCODONE-ACETAMINOPHEN 5-325 MG PO TABS
2.0000 | ORAL_TABLET | Freq: Once | ORAL | Status: AC
  Administered 2013-07-10: 2 via ORAL
  Filled 2013-07-10: qty 2

## 2013-07-10 MED ORDER — POTASSIUM CHLORIDE ER 10 MEQ PO TBCR: 10.0000 meq | EXTENDED_RELEASE_TABLET | Freq: Two times a day (BID) | ORAL | Status: DC

## 2013-07-10 MED ORDER — POTASSIUM CHLORIDE CRYS ER 20 MEQ PO TBCR
40.0000 meq | EXTENDED_RELEASE_TABLET | Freq: Once | ORAL | Status: AC
  Administered 2013-07-10: 40 meq via ORAL
  Filled 2013-07-10: qty 2

## 2013-07-10 MED ORDER — TETANUS-DIPHTH-ACELL PERTUSSIS 5-2.5-18.5 LF-MCG/0.5 IM SUSP
0.5000 mL | Freq: Once | INTRAMUSCULAR | Status: AC
  Administered 2013-07-10: 0.5 mL via INTRAMUSCULAR
  Filled 2013-07-10: qty 0.5

## 2013-07-10 MED ORDER — IOHEXOL 300 MG/ML  SOLN: 100.0000 mL | Freq: Once | INTRAMUSCULAR | Status: DC | PRN

## 2013-07-10 NOTE — Discharge Instructions (Signed)
Refer to attached documents for more information. Return to the ED with worsening or concerning symptoms.  Follow up with your doctor for a recheck of your potassium and kidney function,.
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
## 2013-07-10 NOTE — ED Notes (Signed)
Pa called and made aware pt cts are resulted

## 2013-07-10 NOTE — Progress Notes (Signed)
ED CM spoke with SANE nurse Beecher Mcardle concerning patient being discharge and is homeless. Contacted [employer], patient has utilized his total of allotted days at [employer]. CSW will provide him with a resource list of homeless shelters. Discussed with  Musician.

## 2013-07-10 NOTE — ED Notes (Signed)
PT HAS RECEIVED A PHONE CALL . COULD NOT DISCUSS/CONFIRM THAT PT WAS HERE. IN TO DISCUSS THE XXX  POLICY WITH PT AND HE HAS DECIDED HE WANTS TO CONTINUE TO BE PRIVATE.

## 2013-07-10 NOTE — Progress Notes (Signed)
CSW provided pt with list of homeless shelters and bus pass for transportation.  Pt needs to stay in [facility] as he has a disability appointment in the am.  He plans on sleeping outside this pm and following up with other shelters tomorrow.  Pt cannot go to [facility], but has an appointment with the [employer] on 6/22.  Pt reports no friends/family who would provide him with shelter.
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
## 2013-07-10 NOTE — ED Notes (Signed)
Patient is currently in ct. Will come to pod c after complete

## 2013-07-10 NOTE — ED Notes (Signed)
Ordered diet tray 
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
## 2013-07-10 NOTE — ED Notes (Signed)
Pt in with [facility]

## 2013-07-10 NOTE — ED Provider Notes (Signed)
Patient awake, alert, eating. VSS Patient had K repletion. SANE eval complete. D/C in stable condition.   Gerhard Munch, MD 07/10/13 [phone]
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
## 2013-07-10 NOTE — ED Notes (Signed)
Sane nurse remains at bedside completing her exam. Pt given 4 orange juices and potassium .

## 2013-07-11 ENCOUNTER — Emergency Department (HOSPITAL_COMMUNITY)
Admission: EM | Admit: 2013-07-11 | Discharge: 2013-07-12 | Disposition: A | Discharge status: Home / Self Care | Payer: No Typology Code available for payment source | Attending: Emergency Medicine | Admitting: Emergency Medicine

## 2013-07-11 ENCOUNTER — Encounter (HOSPITAL_COMMUNITY): Payer: Self-pay | Admitting: Emergency Medicine

## 2013-07-11 DIAGNOSIS — Z8659 Personal history of other mental and behavioral disorders: Secondary | ICD-10-CM | POA: Insufficient documentation

## 2013-07-11 DIAGNOSIS — I10 Essential (primary) hypertension: Secondary | ICD-10-CM | POA: Insufficient documentation

## 2013-07-11 DIAGNOSIS — Z79899 Other long term (current) drug therapy: Secondary | ICD-10-CM | POA: Insufficient documentation

## 2013-07-11 DIAGNOSIS — T2129XA Burn of second degree of other site of trunk, initial encounter: Secondary | ICD-10-CM | POA: Insufficient documentation

## 2013-07-11 DIAGNOSIS — T2125XA Burn of second degree of buttock, initial encounter: Secondary | ICD-10-CM

## 2013-07-11 DIAGNOSIS — F319 Bipolar disorder, unspecified: Secondary | ICD-10-CM | POA: Insufficient documentation

## 2013-07-11 DIAGNOSIS — E669 Obesity, unspecified: Secondary | ICD-10-CM | POA: Insufficient documentation

## 2013-07-11 DIAGNOSIS — F17200 Nicotine dependence, unspecified, uncomplicated: Secondary | ICD-10-CM | POA: Insufficient documentation

## 2013-07-11 DIAGNOSIS — G8929 Other chronic pain: Secondary | ICD-10-CM | POA: Insufficient documentation
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
## 2013-07-11 NOTE — ED Notes (Signed)
Also c/o pain in his nose
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
## 2013-07-12 ENCOUNTER — Encounter (HOSPITAL_COMMUNITY): Payer: Self-pay | Admitting: Emergency Medicine

## 2013-07-12 ENCOUNTER — Emergency Department (HOSPITAL_COMMUNITY)
Admission: EM | Admit: 2013-07-12 | Discharge: 2013-07-14 | Disposition: A | Discharge status: Other Acute Inpatient Hospital | Payer: No Typology Code available for payment source | Attending: Emergency Medicine | Admitting: Emergency Medicine

## 2013-07-12 DIAGNOSIS — Z9114 Patient's other noncompliance with medication regimen: Secondary | ICD-10-CM

## 2013-07-12 DIAGNOSIS — F3130 Bipolar disorder, current episode depressed, mild or moderate severity, unspecified: Secondary | ICD-10-CM | POA: Insufficient documentation

## 2013-07-12 DIAGNOSIS — R45851 Suicidal ideations: Secondary | ICD-10-CM

## 2013-07-12 DIAGNOSIS — Z91199 Patient's noncompliance with other medical treatment and regimen due to unspecified reason: Secondary | ICD-10-CM | POA: Insufficient documentation

## 2013-07-12 DIAGNOSIS — F209 Schizophrenia, unspecified: Secondary | ICD-10-CM | POA: Insufficient documentation

## 2013-07-12 DIAGNOSIS — Z79899 Other long term (current) drug therapy: Secondary | ICD-10-CM | POA: Insufficient documentation

## 2013-07-12 DIAGNOSIS — F32A Depression, unspecified: Secondary | ICD-10-CM | POA: Diagnosis present

## 2013-07-12 DIAGNOSIS — R443 Hallucinations, unspecified: Secondary | ICD-10-CM | POA: Insufficient documentation

## 2013-07-12 DIAGNOSIS — Z91148 Patient's other noncompliance with medication regimen for other reason: Secondary | ICD-10-CM

## 2013-07-12 DIAGNOSIS — Z9119 Patient's noncompliance with other medical treatment and regimen: Secondary | ICD-10-CM | POA: Insufficient documentation

## 2013-07-12 DIAGNOSIS — F329 Major depressive disorder, single episode, unspecified: Secondary | ICD-10-CM | POA: Diagnosis present

## 2013-07-12 DIAGNOSIS — F1410 Cocaine abuse, uncomplicated: Secondary | ICD-10-CM

## 2013-07-12 DIAGNOSIS — E669 Obesity, unspecified: Secondary | ICD-10-CM | POA: Insufficient documentation

## 2013-07-12 DIAGNOSIS — G8929 Other chronic pain: Secondary | ICD-10-CM | POA: Insufficient documentation

## 2013-07-12 DIAGNOSIS — F1910 Other psychoactive substance abuse, uncomplicated: Secondary | ICD-10-CM | POA: Diagnosis present

## 2013-07-12 DIAGNOSIS — I10 Essential (primary) hypertension: Secondary | ICD-10-CM | POA: Insufficient documentation

## 2013-07-12 DIAGNOSIS — F17200 Nicotine dependence, unspecified, uncomplicated: Secondary | ICD-10-CM | POA: Insufficient documentation

## 2013-07-12 LAB — CBC
HEMATOCRIT: 35.7 % — AB (ref 39.0–52.0)
HEMOGLOBIN: 12.3 g/dL — AB (ref 13.0–17.0)
MCH: 30.1 pg (ref 26.0–34.0)
MCHC: 34.5 g/dL (ref 30.0–36.0)
MCV: 87.3 fL (ref 78.0–100.0)
Platelets: 301 10*3/uL (ref 150–400)
RBC: 4.09 MIL/uL — ABNORMAL LOW (ref 4.22–5.81)
RDW: 12.4 % (ref 11.5–15.5)
WBC: 9.8 10*3/uL (ref 4.0–10.5)

## 2013-07-12 LAB — RAPID URINE DRUG SCREEN, HOSP PERFORMED
Amphetamines: NOT DETECTED
BARBITURATES: NOT DETECTED
BENZODIAZEPINES: NOT DETECTED
Cocaine: POSITIVE — AB
Opiates: NOT DETECTED
Tetrahydrocannabinol: NOT DETECTED

## 2013-07-12 MED ORDER — ZOLPIDEM TARTRATE 5 MG PO TABS
5.0000 mg | ORAL_TABLET | Freq: Every evening | ORAL | Status: DC | PRN
  Administered 2013-07-13: 5 mg via ORAL
  Filled 2013-07-12: qty 1

## 2013-07-12 MED ORDER — IBUPROFEN 200 MG PO TABS
600.0000 mg | ORAL_TABLET | Freq: Three times a day (TID) | ORAL | Status: DC | PRN
  Administered 2013-07-13: 600 mg via ORAL
  Filled 2013-07-12: qty 3

## 2013-07-12 MED ORDER — OXYCODONE HCL 5 MG PO TABS: 5.0000 mg | ORAL_TABLET | ORAL | Status: DC | PRN

## 2013-07-12 MED ORDER — LORAZEPAM 1 MG PO TABS: 1.0000 mg | ORAL_TABLET | Freq: Three times a day (TID) | ORAL | Status: DC | PRN

## 2013-07-12 MED ORDER — SILVER SULFADIAZINE 1 % EX CREA
TOPICAL_CREAM | Freq: Once | CUTANEOUS | Status: AC
  Administered 2013-07-12: 01:00:00 via TOPICAL
  Filled 2013-07-12: qty 85

## 2013-07-12 MED ORDER — OXYCODONE HCL 5 MG PO TABS
5.0000 mg | ORAL_TABLET | Freq: Once | ORAL | Status: AC
  Administered 2013-07-12: 5 mg via ORAL
  Filled 2013-07-12: qty 1

## 2013-07-12 MED ORDER — DIVALPROEX SODIUM 500 MG PO DR TAB
500.0000 mg | DELAYED_RELEASE_TABLET | Freq: Two times a day (BID) | ORAL | Status: DC
  Administered 2013-07-13 – 2013-07-14 (×5): 500 mg via ORAL
  Filled 2013-07-12 (×5): qty 1

## 2013-07-12 MED ORDER — ONDANSETRON 4 MG PO TBDP
8.0000 mg | ORAL_TABLET | Freq: Once | ORAL | Status: AC
Start: 2013-07-12 — End: 2013-07-12
  Administered 2013-07-12: 8 mg via ORAL
  Filled 2013-07-12: qty 2

## 2013-07-12 MED ORDER — ARIPIPRAZOLE 10 MG PO TABS
20.0000 mg | ORAL_TABLET | Freq: Every day | ORAL | Status: DC
  Administered 2013-07-13 – 2013-07-14 (×2): 20 mg via ORAL
  Filled 2013-07-12 (×2): qty 2

## 2013-07-12 MED ORDER — ALUM & MAG HYDROXIDE-SIMETH 200-200-20 MG/5ML PO SUSP: 30.0000 mL | ORAL | Status: DC | PRN

## 2013-07-12 MED ORDER — SILVER SULFADIAZINE 1 % EX CREA
1.0000 "application " | TOPICAL_CREAM | Freq: Every day | CUTANEOUS | Status: DC
  Filled 2013-07-12: qty 85

## 2013-07-12 MED ORDER — NICOTINE 21 MG/24HR TD PT24
21.0000 mg | MEDICATED_PATCH | Freq: Every day | TRANSDERMAL | Status: DC
  Filled 2013-07-12: qty 1

## 2013-07-12 MED ORDER — ONDANSETRON HCL 4 MG PO TABS: 4.0000 mg | ORAL_TABLET | Freq: Three times a day (TID) | ORAL | Status: DC | PRN

## 2013-07-12 MED ORDER — SERTRALINE HCL 50 MG PO TABS
25.0000 mg | ORAL_TABLET | Freq: Every day | ORAL | Status: DC
  Administered 2013-07-13: 25 mg via ORAL
  Filled 2013-07-12: qty 1

## 2013-07-12 MED ORDER — POTASSIUM CHLORIDE ER 10 MEQ PO TBCR
10.0000 meq | EXTENDED_RELEASE_TABLET | Freq: Two times a day (BID) | ORAL | Status: DC
  Administered 2013-07-13 – 2013-07-14 (×5): 10 meq via ORAL
  Filled 2013-07-12 (×5): qty 1
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
## 2013-07-12 NOTE — ED Notes (Signed)
Patient is brought by [employer] to the room because he is on involuntary commitment. [facility]. The patient is homeless and awaiting placement. The patient is IVC'd r/t SI  - plan to jump of bridge

## 2013-07-12 NOTE — ED Notes (Signed)
Bed: [id number] Expected date:  Expected time:  Means of arrival:  Comments: [facility]

## 2013-07-12 NOTE — Discharge Instructions (Signed)
Burn Care Your skin is a natural barrier to infection. It is the largest organ of your body. Burns damage this natural protection. To help prevent infection, it is very important to follow your caregiver's instructions in the care of your burn. Burns are classified as:  First degree. There is only redness of the skin (erythema). No scarring is expected.  Second degree. There is blistering of the skin. Scarring may occur with deeper burns.  Third degree. All layers of the skin are injured, and scarring is expected. HOME CARE INSTRUCTIONS   Wash your hands well before changing your bandage.  Change your bandage as often as directed by your caregiver.  Remove the old bandage. If the bandage sticks, you may soak it off with cool, clean water.  Cleanse the burn thoroughly but gently with mild soap and water.  Pat the area dry with a clean, dry cloth.  Apply a thin layer of antibacterial cream to the burn.  Apply a clean bandage as instructed by your caregiver.  Keep the bandage as clean and dry as possible.  Elevate the affected area for the first 24 hours, then as instructed by your caregiver.  Only take over-the-counter or prescription medicines for pain, discomfort, or fever as directed by your caregiver. SEEK IMMEDIATE MEDICAL CARE IF:   You develop excessive pain.  You develop redness, tenderness, swelling, or red streaks near the burn.  The burned area develops yellowish-white fluid (pus) or a bad smell.  You have a fever. MAKE SURE YOU:   Understand these instructions.  Will watch your condition.  Will get help right away if you are not doing well or get worse. Document Released: 01/17/2005 Document Revised: 04/11/2011 Document Reviewed: 06/09/2010 [facility] Patient Information 2014 [employer] [place].  Oxycodone tablets or capsules What is this medicine? OXYCODONE (ox i KOE done) is a pain reliever. It is used to treat moderate to severe pain. This medicine may be  used for other purposes; ask your health care provider or pharmacist if you have questions. COMMON BRAND NAME(S): Dazidox , Endocodone , OXECTA, OxyIR, Percolone, Roxicodone What should I tell my health care provider before I take this medicine? They need to know if you have any of these conditions: -Addison's disease -brain tumor -drug abuse or addiction -head injury -heart disease -if you frequently drink alcohol containing drinks -kidney disease or problems going to the bathroom -liver disease -lung disease, asthma, or breathing problems -mental problems -an unusual or allergic reaction to oxycodone, codeine, hydrocodone, morphine, other medicines, foods, dyes, or preservatives -pregnant or trying to get pregnant -breast-feeding How should I use this medicine? Take this medicine by mouth with a glass of water. Follow the directions on the prescription label. You can take it with or without food. If it upsets your stomach, take it with food. Take your medicine at regular intervals. Do not take it more often than directed. Do not stop taking except on your doctor's advice. Some brands of this medicine, like Oxecta, have special instructions. Ask your doctor or pharmacist if these directions are for you: Do not cut, crush or chew this medicine. Swallow only one tablet at a time. Do not wet, soak, or lick the tablet before you take it. Talk to your pediatrician regarding the use of this medicine in children. Special care may be needed. Overdosage: If you think you have taken too much of this medicine contact a poison control center or emergency room at once. NOTE: This medicine is only for you.  Do not share this medicine with others. What if I miss a dose? If you miss a dose, take it as soon as you can. If it is almost time for your next dose, take only that dose. Do not take double or extra doses. What may interact with this medicine? -alcohol -antihistamines -certain medicines used for  nausea like chlorpromazine, droperidol -erythromycin -ketoconazole -medicines for depression, anxiety, or psychotic disturbances -medicines for sleep -muscle relaxants -naloxone -naltrexone -narcotic medicines (opiates) for pain -nilotinib -phenobarbital -phenytoin -rifampin -ritonavir -voriconazole This list may not describe all possible interactions. Give your health care provider a list of all the medicines, herbs, non-prescription drugs, or dietary supplements you use. Also tell them if you smoke, drink alcohol, or use illegal drugs. Some items may interact with your medicine. What should I watch for while using this medicine? Tell your doctor or health care professional if your pain does not go away, if it gets worse, or if you have new or a different type of pain. You may develop tolerance to the medicine. Tolerance means that you will need a higher dose of the medicine for pain relief. Tolerance is normal and is expected if you take this medicine for a long time. Do not suddenly stop taking your medicine because you may develop a severe reaction. Your body becomes used to the medicine. This does NOT mean you are addicted. Addiction is a behavior related to getting and using a drug for a non-medical reason. If you have pain, you have a medical reason to take pain medicine. Your doctor will tell you how much medicine to take. If your doctor wants you to stop the medicine, the dose will be slowly lowered over time to avoid any side effects. You may get drowsy or dizzy when you first start taking this medicine or change doses. Do not drive, use machinery, or do anything that may be dangerous until you know how the medicine affects you. Stand or sit up slowly. There are different types of narcotic medicines (opiates) for pain. If you take more than one type at the same time, you may have more side effects. Give your health care provider a list of all medicines you use. Your doctor will tell you  how much medicine to take. Do not take more medicine than directed. Call emergency for help if you have problems breathing. This medicine will cause constipation. Try to have a bowel movement at least every 2 to 3 days. If you do not have a bowel movement for 3 days, call your doctor or health care professional. Your mouth may get dry. Drinking water, chewing sugarless gum, or sucking on hard candy may help. See your dentist every 6 months. What side effects may I notice from receiving this medicine? Side effects that you should report to your doctor or health care professional as soon as possible: -allergic reactions like skin rash, itching or hives, swelling of the face, lips, or tongue -breathing problems -confusion -feeling faint or lightheaded, falls -trouble passing urine or change in the amount of urine -unusually weak or tired Side effects that usually do not require medical attention (report to your doctor or health care professional if they continue or are bothersome): -constipation -dry mouth -itching -nausea, vomiting -upset stomach This list may not describe all possible side effects. Call your doctor for medical advice about side effects. You may report side effects to FDA at 1-800-FDA-1088. Where should I keep my medicine? Keep out of the reach of children. This  medicine can be abused. Keep your medicine in a safe place to protect it from theft. Do not share this medicine with anyone. Selling or giving away this medicine is dangerous and against the law. Store at room temperature between 15 and 30 degrees C (59 and 86 degrees F). Protect from light. Keep container tightly closed. This medicine may cause accidental overdose and death if it is taken by other adults, children, or pets. Flush any unused medicine down the toilet to reduce the chance of harm. Do not use the medicine after the expiration date. NOTE: This sheet is a summary. It may not cover all possible information. If  you have questions about this medicine, talk to your doctor, pharmacist, or health care provider.  2014, Elsevier/Gold Standard. (2012-09-27 13:43:33)

## 2013-07-12 NOTE — ED Notes (Signed)
Pt at nurse's station request beverage and update on discharge.  Pt advise on status.

## 2013-07-12 NOTE — ED Notes (Signed)
Patient asked for and received a Malawiturkey sandwich and a sprite.

## 2013-07-13 ENCOUNTER — Encounter (HOSPITAL_COMMUNITY): Payer: Self-pay | Admitting: Psychiatry

## 2013-07-13 DIAGNOSIS — F1910 Other psychoactive substance abuse, uncomplicated: Secondary | ICD-10-CM

## 2013-07-13 DIAGNOSIS — R45851 Suicidal ideations: Secondary | ICD-10-CM

## 2013-07-13 DIAGNOSIS — R45850 Homicidal ideations: Secondary | ICD-10-CM

## 2013-07-13 DIAGNOSIS — F32A Depression, unspecified: Secondary | ICD-10-CM | POA: Diagnosis present

## 2013-07-13 DIAGNOSIS — F329 Major depressive disorder, single episode, unspecified: Secondary | ICD-10-CM | POA: Diagnosis present

## 2013-07-13 DIAGNOSIS — F3130 Bipolar disorder, current episode depressed, mild or moderate severity, unspecified: Secondary | ICD-10-CM

## 2013-07-13 LAB — COMPREHENSIVE METABOLIC PANEL
ALBUMIN: 3.6 g/dL (ref 3.5–5.2)
ALT: 27 U/L (ref 0–53)
AST: 29 U/L (ref 0–37)
Alkaline Phosphatase: 70 U/L (ref 39–117)
BUN: 10 mg/dL (ref 6–23)
CO2: 29 mEq/L (ref 19–32)
CREATININE: 1.34 mg/dL (ref 0.50–1.35)
Calcium: 9.5 mg/dL (ref 8.4–10.5)
Chloride: 101 mEq/L (ref 96–112)
GFR calc Af Amer: 77 mL/min — ABNORMAL LOW (ref >90)
GFR, EST NON AFRICAN AMERICAN: 66 mL/min — AB (ref >90)
Glucose, Bld: 116 mg/dL — ABNORMAL HIGH (ref 70–99)
Potassium: 3.1 mEq/L — ABNORMAL LOW (ref 3.7–5.3)
Sodium: 141 mEq/L (ref 137–147)
Total Bilirubin: 0.3 mg/dL (ref 0.3–1.2)
Total Protein: 7.1 g/dL (ref 6.0–8.3)

## 2013-07-13 LAB — VALPROIC ACID LEVEL

## 2013-07-13 LAB — ETHANOL: Alcohol, Ethyl (B): <11 mg/dL (ref 0–11)

## 2013-07-13 LAB — ACETAMINOPHEN LEVEL: Acetaminophen (Tylenol), Serum: <15 ug/mL (ref 10–30)

## 2013-07-13 LAB — SALICYLATE LEVEL

## 2013-07-13 MED ORDER — POTASSIUM CHLORIDE CRYS ER 20 MEQ PO TBCR
60.0000 meq | EXTENDED_RELEASE_TABLET | Freq: Once | ORAL | Status: DC
  Filled 2013-07-13: qty 3

## 2013-07-13 MED ORDER — HYDROCHLOROTHIAZIDE 25 MG PO TABS
25.0000 mg | ORAL_TABLET | Freq: Every day | ORAL | Status: DC
  Administered 2013-07-13 – 2013-07-14 (×2): 25 mg via ORAL
  Filled 2013-07-13 (×2): qty 1

## 2013-07-13 MED ORDER — CLONIDINE HCL 0.1 MG PO TABS
0.1000 mg | ORAL_TABLET | Freq: Every day | ORAL | Status: DC
  Filled 2013-07-13: qty 1

## 2013-07-13 MED ORDER — SILVER SULFADIAZINE 1 % EX CREA
TOPICAL_CREAM | Freq: Every day | CUTANEOUS | Status: DC
  Administered 2013-07-13 – 2013-07-14 (×2): via TOPICAL
  Filled 2013-07-13: qty 50

## 2013-07-13 MED ORDER — LISINOPRIL 10 MG PO TABS
10.0000 mg | ORAL_TABLET | Freq: Once | ORAL | Status: DC
  Filled 2013-07-13: qty 1

## 2013-07-13 MED ORDER — POTASSIUM CHLORIDE CRYS ER 20 MEQ PO TBCR
20.0000 meq | EXTENDED_RELEASE_TABLET | Freq: Once | ORAL | Status: AC
  Administered 2013-07-13: 20 meq via ORAL

## 2013-07-13 MED ORDER — HYDROCHLOROTHIAZIDE 12.5 MG PO CAPS
25.0000 mg | ORAL_CAPSULE | Freq: Every day | ORAL | Status: DC
  Filled 2013-07-13: qty 2

## 2013-07-13 MED ORDER — OXYCODONE-ACETAMINOPHEN 5-325 MG PO TABS
1.0000 | ORAL_TABLET | ORAL | Status: DC | PRN
  Administered 2013-07-13 (×2): 2 via ORAL
  Administered 2013-07-14: 1 via ORAL
  Administered 2013-07-14: 2 via ORAL
  Filled 2013-07-13: qty 2
  Filled 2013-07-13: qty 1
  Filled 2013-07-13 (×2): qty 2

## 2013-07-13 MED ORDER — LISINOPRIL 10 MG PO TABS
10.0000 mg | ORAL_TABLET | Freq: Every day | ORAL | Status: DC
  Administered 2013-07-13 – 2013-07-14 (×2): 10 mg via ORAL
  Filled 2013-07-13 (×2): qty 1

## 2013-07-13 MED ORDER — SERTRALINE HCL 50 MG PO TABS
50.0000 mg | ORAL_TABLET | Freq: Every day | ORAL | Status: DC
  Administered 2013-07-13 – 2013-07-14 (×2): 50 mg via ORAL
  Filled 2013-07-13 (×2): qty 1

## 2013-07-13 NOTE — ED Notes (Signed)
Dr Juleen ChinaKohut aware of BP's and potassium

## 2013-07-13 NOTE — ED Notes (Signed)
Up tot he bathroom to shower and change scrubs 

## 2013-07-13 NOTE — ED Notes (Signed)
Sleeping soundly, easily aroused.  Pt reports that he has a hx of HTN and has not been taking any meds for it for the past 1.5836yrs.

## 2013-07-13 NOTE — ED Notes (Signed)
Up to the bathroom 
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
## 2013-07-13 NOTE — Progress Notes (Signed)
Pt's referral has been faxed to the following facilities with bed availability:  [facility]- per [facility] beds available [facility]- per Tammy can fax for review [facility]- per Delana MeyerNakeisha can fax [facility]- per Christiane HaJonathan can fax for review  *the following facilities at capacity or no SA programming: [facility]- [facility]- per Gaylan GeroldSusan [facility]- per [facility]- [facility]- per [facility]- per [facility]- per [facility] [place] [facility]- per Susie no SA [facility]- per [facility] [place] available but SA programming [facility]- per Dannielle Huhanny low acuity only [facility]- do not have SA [facility]- per Lurena Joinerebecca at [employer] [employer]- no adult beds [facility]- adolescent beds only [facility]- per Alecia LemmingVictor at [employer]    Danine Hor Disposition [facility]
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
## 2013-07-13 NOTE — ED Notes (Signed)
In bathroom
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
## 2013-07-14 ENCOUNTER — Inpatient Hospital Stay (HOSPITAL_COMMUNITY)
Admission: AD | Admit: 2013-07-14 | Discharge: 2013-07-18 | DRG: 885 | Disposition: A | Discharge status: Home / Self Care | Payer: Self-pay | Source: Intra-hospital | Attending: Psychiatry | Admitting: Psychiatry

## 2013-07-14 ENCOUNTER — Encounter (HOSPITAL_COMMUNITY): Payer: Self-pay | Admitting: Registered Nurse

## 2013-07-14 ENCOUNTER — Encounter (HOSPITAL_COMMUNITY): Payer: Self-pay

## 2013-07-14 DIAGNOSIS — M549 Dorsalgia, unspecified: Secondary | ICD-10-CM | POA: Diagnosis present

## 2013-07-14 DIAGNOSIS — F32A Depression, unspecified: Secondary | ICD-10-CM

## 2013-07-14 DIAGNOSIS — Z7982 Long term (current) use of aspirin: Secondary | ICD-10-CM

## 2013-07-14 DIAGNOSIS — F209 Schizophrenia, unspecified: Secondary | ICD-10-CM | POA: Diagnosis present

## 2013-07-14 DIAGNOSIS — F4310 Post-traumatic stress disorder, unspecified: Secondary | ICD-10-CM | POA: Diagnosis present

## 2013-07-14 DIAGNOSIS — F329 Major depressive disorder, single episode, unspecified: Secondary | ICD-10-CM | POA: Diagnosis present

## 2013-07-14 DIAGNOSIS — F17200 Nicotine dependence, unspecified, uncomplicated: Secondary | ICD-10-CM | POA: Diagnosis present

## 2013-07-14 DIAGNOSIS — G8929 Other chronic pain: Secondary | ICD-10-CM | POA: Diagnosis present

## 2013-07-14 DIAGNOSIS — I10 Essential (primary) hypertension: Secondary | ICD-10-CM

## 2013-07-14 DIAGNOSIS — R45851 Suicidal ideations: Secondary | ICD-10-CM

## 2013-07-14 DIAGNOSIS — E876 Hypokalemia: Secondary | ICD-10-CM | POA: Diagnosis present

## 2013-07-14 DIAGNOSIS — F332 Major depressive disorder, recurrent severe without psychotic features: Principal | ICD-10-CM | POA: Diagnosis present

## 2013-07-14 DIAGNOSIS — F3289 Other specified depressive episodes: Secondary | ICD-10-CM

## 2013-07-14 DIAGNOSIS — F411 Generalized anxiety disorder: Secondary | ICD-10-CM | POA: Diagnosis present

## 2013-07-14 DIAGNOSIS — G4730 Sleep apnea, unspecified: Secondary | ICD-10-CM | POA: Diagnosis present

## 2013-07-14 DIAGNOSIS — F1410 Cocaine abuse, uncomplicated: Secondary | ICD-10-CM | POA: Diagnosis present

## 2013-07-14 MED ORDER — POTASSIUM CHLORIDE ER 10 MEQ PO TBCR
10.0000 meq | EXTENDED_RELEASE_TABLET | Freq: Two times a day (BID) | ORAL | Status: DC
  Administered 2013-07-15 – 2013-07-16 (×4): 10 meq via ORAL
  Filled 2013-07-14 (×8): qty 1

## 2013-07-14 MED ORDER — SILVER SULFADIAZINE 1 % EX CREA
1.0000 "application " | TOPICAL_CREAM | Freq: Every day | CUTANEOUS | Status: DC
  Administered 2013-07-16 – 2013-07-18 (×3): 1 via TOPICAL
  Filled 2013-07-14 (×2): qty 50

## 2013-07-14 MED ORDER — LORAZEPAM 1 MG PO TABS
1.0000 mg | ORAL_TABLET | Freq: Three times a day (TID) | ORAL | Status: DC | PRN
  Administered 2013-07-15 – 2013-07-18 (×2): 1 mg via ORAL
  Filled 2013-07-14: qty 1

## 2013-07-14 MED ORDER — NICOTINE 21 MG/24HR TD PT24
21.0000 mg | MEDICATED_PATCH | Freq: Every day | TRANSDERMAL | Status: DC
  Administered 2013-07-15 – 2013-07-17 (×3): 21 mg via TRANSDERMAL
  Filled 2013-07-14 (×7): qty 1

## 2013-07-14 MED ORDER — HYDROCHLOROTHIAZIDE 25 MG PO TABS
25.0000 mg | ORAL_TABLET | Freq: Every day | ORAL | Status: DC
  Administered 2013-07-15 – 2013-07-18 (×4): 25 mg via ORAL
  Filled 2013-07-14 (×9): qty 1

## 2013-07-14 MED ORDER — SILVER SULFADIAZINE 1 % EX CREA: TOPICAL_CREAM | Freq: Every day | CUTANEOUS | Status: DC

## 2013-07-14 MED ORDER — DIVALPROEX SODIUM 500 MG PO DR TAB
500.0000 mg | DELAYED_RELEASE_TABLET | Freq: Two times a day (BID) | ORAL | Status: DC
  Administered 2013-07-15 – 2013-07-18 (×7): 500 mg via ORAL
  Filled 2013-07-14 (×14): qty 1

## 2013-07-14 MED ORDER — MAGNESIUM HYDROXIDE 400 MG/5ML PO SUSP
30.0000 mL | Freq: Every day | ORAL | Status: DC | PRN
  Administered 2013-07-15: 30 mL via ORAL

## 2013-07-14 MED ORDER — ARIPIPRAZOLE 10 MG PO TABS
20.0000 mg | ORAL_TABLET | Freq: Every day | ORAL | Status: DC
  Administered 2013-07-15 – 2013-07-16 (×2): 20 mg via ORAL
  Filled 2013-07-14 (×5): qty 2

## 2013-07-14 MED ORDER — IBUPROFEN 600 MG PO TABS
600.0000 mg | ORAL_TABLET | Freq: Three times a day (TID) | ORAL | Status: DC | PRN
  Administered 2013-07-15 – 2013-07-18 (×4): 600 mg via ORAL
  Filled 2013-07-14 (×4): qty 1

## 2013-07-14 MED ORDER — SERTRALINE HCL 50 MG PO TABS
50.0000 mg | ORAL_TABLET | Freq: Every day | ORAL | Status: DC
  Administered 2013-07-15 – 2013-07-17 (×3): 50 mg via ORAL
  Filled 2013-07-14 (×9): qty 1

## 2013-07-14 MED ORDER — LISINOPRIL 10 MG PO TABS
10.0000 mg | ORAL_TABLET | Freq: Every day | ORAL | Status: DC
  Administered 2013-07-15: 10 mg via ORAL
  Filled 2013-07-14 (×3): qty 1
  Filled 2013-07-14: qty 2

## 2013-07-14 NOTE — Tx Team (Signed)
Initial Interdisciplinary Treatment Plan  PATIENT STRENGTHS: (choose at least two) Ability for insight Capable of independent living Communication skills Motivation for treatment/growth  PATIENT STRESSORS: Financial difficulties Health problems Medication change or noncompliance Traumatic event   PROBLEM LIST: Problem List/Patient Goals Date to be addressed Date deferred Reason deferred Estimated date of resolution  SI 07/14/13     depression 07/14/13     Anxiety 07/14/13     Bipolar 07/14/13     Schizoaffective                               DISCHARGE CRITERIA:  Ability to meet basic life and health needs Adequate post-discharge living arrangements Improved stabilization in mood, thinking, and/or behavior Reduction of life-threatening or endangering symptoms to within safe limits Safe-care adequate arrangements made  PRELIMINARY DISCHARGE PLAN: Attend aftercare/continuing care group Attend PHP/IOP Outpatient therapy Placement in alternative living arrangements  PATIENT/FAMIILY INVOLVEMENT: This treatment plan has been presented to and reviewed with the patient, Ryan Horn.  The patient and family have been given the opportunity to ask questions and make suggestions.  [name], [name] 07/14/2013, 10:35 PM

## 2013-07-14 NOTE — ED Notes (Signed)
Up tot he bathroom to shower and change scrubs 

## 2013-07-14 NOTE — ED Notes (Signed)
Up to the bathroom 

## 2013-07-14 NOTE — ED Provider Notes (Signed)
Medical screening examination/treatment/procedure(s) were performed by non-physician practitioner and as supervising physician I was immediately available for consultation/collaboration.   EKG Interpretation None       Ardean Simonich, MD 07/14/13 0618 

## 2013-07-14 NOTE — ED Notes (Signed)
Dr Shela Commonsj and Denice Borsshuvon NP into see

## 2013-07-14 NOTE — Progress Notes (Signed)
Follow-up calls have been placed to the following to check on referral status:  [facility], referral re-faxed [facility]- [facility] been received, referral re-faxed [facility]- per Christiane HaJonathan pt has been declined d/t MD feeling pt's primary problem is [facility]- per Olegario MessierKathy currently at capacity so referrals are not being reviewed at this time [facility] per Victorino DikeJennifer "at high capacity" not taking referrals [facility] [place] not received, referral re-faxed [place]- per Ad[place] LaundryLatonya- at capacity [place] and review referrals until Monday 6.15.15 [facility]- per beth at capacity [facility] [place] wait list only [facility] Deanna ArtisKeisha pending review   Tomi BambergerMariya Eurika Sandy Disposition M[facility]
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
## 2013-07-14 NOTE — ED Notes (Signed)
[facility]
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
## 2013-07-14 NOTE — ED Notes (Signed)
Nad, sitting in activity room watching tv and talking
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
## 2013-07-15 MED ORDER — LISINOPRIL 20 MG PO TABS
20.0000 mg | ORAL_TABLET | Freq: Every day | ORAL | Status: DC
  Administered 2013-07-16 – 2013-07-18 (×3): 20 mg via ORAL
  Filled 2013-07-15 (×7): qty 1
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
## 2013-07-15 NOTE — Progress Notes (Signed)
Didn't attended group 
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
## 2013-07-15 NOTE — Progress Notes (Signed)
  D: Pt observed sleeping in bed with eyes closed. RR even and unlabored. No distress noted  .  A: Q 15 minute checks were done for safety.  R: safety maintained on unit.  
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
## 2013-07-15 NOTE — Progress Notes (Signed)
Just got her and attended the group,

## 2013-07-15 NOTE — [facility] Group Notes (Signed)
[facility] LCSW Group Therapy  07/15/2013   1:15 PM   Type of Therapy:  Group Therapy  Participation Level:  Minimal  Participation Quality: Sleeping, Minimal  Affect:  Depressed and Flat  Cognitive:  Drowsy  Insight:  Limited, Lacking  Engagement in Therapy:  Limited, Lacking  Modes of Intervention:  Clarification, Confrontation, Discussion, Education, Exploration, Limit-setting, Orientation, Problem-solving, Rapport Building, Dance movement psychotherapisteality Testing, Socialization and Support  Summary of Progress/Problems: Pt identified obstacles faced currently and processed barriers involved in overcoming these obstacles. Pt identified steps necessary for overcoming these obstacles and explored motivation (internal and external) for facing these difficulties head on. Pt further identified one area of concern in their lives and chose a goal to focus on for today.  Pt slept for the duration of group and did not participate.    Ryan IvanChelsea Horton, LCSW 07/15/2013  3:20 PM
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
## 2013-07-15 NOTE — Progress Notes (Signed)
D: Pt. Interacting with staff and peers appropriately on unit.  Pt. Denies SI, HI and AVH.  A: Will continue to monitor for safety. Pt. Given emotional support from RN. Pt. Medication routine continued. Pt. Encouraged to come to me with questions.   R: Pt. Remains appropriate and cooperative. Will continue to monitor pt. For safety. 

## 2013-07-15 NOTE — [facility] Group Notes (Signed)
[facility]   07/15/2013 8:45 AM  Participation Quality:  Alert, Appropriate and Oriented  Mood/Affect:  Drowsy  Depression Rating:  5  Anxiety Rating:  3  Thoughts of Suicide:  Pt denies SI/HI  Will you contract for safety?   Yes  Current AVH:  Pt denies  Plan for Discharge/Comments:  Pt attended discharge planning group and actively participated in group.  CSW provided pt with today's workbook.  Pt was observed dozing off in group, having a hard time staying awake.  Pt states that he [place] homeless [place], staying on the streets.  Pt [facility] and therapy.  CSW will assess for pt's needs as pt becomes more stable.  Pt requested a pair of shoes and pants; CSW will see what is available in the donation closet.  No further needs voiced by pt at this time.    Transportation Means: Pt reports access to transportation  Supports: No supports mentioned at this time  Reyes IvanChelsea Horton, LCSW 07/15/2013 10:10 AM

## 2013-07-16 ENCOUNTER — Other Ambulatory Visit: Payer: Self-pay

## 2013-07-16 DIAGNOSIS — E876 Hypokalemia: Secondary | ICD-10-CM

## 2013-07-16 DIAGNOSIS — I10 Essential (primary) hypertension: Secondary | ICD-10-CM

## 2013-07-16 DIAGNOSIS — F329 Major depressive disorder, single episode, unspecified: Secondary | ICD-10-CM

## 2013-07-16 DIAGNOSIS — F3130 Bipolar disorder, current episode depressed, mild or moderate severity, unspecified: Secondary | ICD-10-CM

## 2013-07-16 LAB — BASIC METABOLIC PANEL
BUN: 11 mg/dL (ref 6–23)
CO2: 31 mEq/L (ref 19–32)
Calcium: 9.6 mg/dL (ref 8.4–10.5)
Chloride: 98 mEq/L (ref 96–112)
Creatinine, Ser: 1.08 mg/dL (ref 0.50–1.35)
GFR calc Af Amer: >90 mL/min (ref >90)
GFR, EST NON AFRICAN AMERICAN: 86 mL/min — AB (ref >90)
Glucose, Bld: 92 mg/dL (ref 70–99)
Potassium: 3.2 mEq/L — ABNORMAL LOW (ref 3.7–5.3)
SODIUM: 142 meq/L (ref 137–147)

## 2013-07-16 LAB — TSH: TSH: 2.65 u[IU]/mL (ref 0.350–4.500)

## 2013-07-16 MED ORDER — POTASSIUM CHLORIDE CRYS ER 20 MEQ PO TBCR
40.0000 meq | EXTENDED_RELEASE_TABLET | Freq: Once | ORAL | Status: AC
  Administered 2013-07-16: 40 meq via ORAL
  Filled 2013-07-16: qty 2
  Filled 2013-07-16: qty 4

## 2013-07-16 MED ORDER — OXYCODONE-ACETAMINOPHEN 5-325 MG PO TABS
1.0000 | ORAL_TABLET | Freq: Four times a day (QID) | ORAL | Status: DC | PRN
  Administered 2013-07-16 – 2013-07-18 (×5): 1 via ORAL
  Filled 2013-07-16 (×5): qty 1

## 2013-07-16 MED ORDER — ARIPIPRAZOLE 10 MG PO TABS
10.0000 mg | ORAL_TABLET | Freq: Every day | ORAL | Status: DC
  Administered 2013-07-17 – 2013-07-18 (×2): 10 mg via ORAL
  Filled 2013-07-16 (×5): qty 1

## 2013-07-16 MED ORDER — ASPIRIN EC 81 MG PO TBEC
81.0000 mg | DELAYED_RELEASE_TABLET | Freq: Every day | ORAL | Status: DC
  Administered 2013-07-17 – 2013-07-18 (×2): 81 mg via ORAL
  Filled 2013-07-16 (×6): qty 1

## 2013-07-16 MED ORDER — HYDRALAZINE HCL 10 MG PO TABS
10.0000 mg | ORAL_TABLET | Freq: Four times a day (QID) | ORAL | Status: DC
  Administered 2013-07-16 – 2013-07-17 (×5): 10 mg via ORAL
  Filled 2013-07-16 (×10): qty 1

## 2013-07-16 MED ORDER — CLONIDINE HCL 0.1 MG/24HR TD PTWK
0.1000 mg | MEDICATED_PATCH | TRANSDERMAL | Status: DC
  Administered 2013-07-16: 0.1 mg via TRANSDERMAL
  Filled 2013-07-16 (×2): qty 1

## 2013-07-16 MED ORDER — HYDRALAZINE HCL 20 MG/ML IJ SOLN
10.0000 mg | Freq: Once | INTRAMUSCULAR | Status: AC
  Administered 2013-07-16: 10 mg via INTRAMUSCULAR
  Filled 2013-07-16: qty 0.5

## 2013-07-16 MED ORDER — AMLODIPINE BESYLATE 10 MG PO TABS
10.0000 mg | ORAL_TABLET | Freq: Every day | ORAL | Status: DC
  Administered 2013-07-16: 10 mg via ORAL
  Filled 2013-07-16: qty 1
  Filled 2013-07-16: qty 2
  Filled 2013-07-16: qty 1

## 2013-07-16 NOTE — [facility] Suicide Risk Assessment (Signed)
[facility] INPATIENT:  Family/Significant Other Suicide Prevention Education  Suicide Prevention Education:  Education Completed; Elmore GuiseKimberly Fields - sister [phone]),  (name of family member/significant other) has been identified by the patient as the family member/significant other with whom the patient will be residing, and identified as the person(s) who will aid the patient in the event of a mental health crisis (suicidal ideations/suicide attempt).  With written consent from the patient, the family member/significant other has been provided the following suicide prevention education, prior to the and/or following the discharge of the patient.  The suicide prevention education provided includes the following:  Suicide risk factors  Suicide prevention and interventions  National Suicide Hotline telephone number  [facility] assessment telephone number  [facility] Emergency Assistance 911  [facility] and/or Residential Mobile Crisis Unit telephone number  Request made of family/significant other to:  Remove weapons (e.g., guns, rifles, knives), all items previously/currently identified as safety concern.    Remove drugs/medications (over-the-counter, prescriptions, illicit drugs), all items previously/currently identified as a safety concern.  The family member/significant other verbalizes understanding of the suicide prevention education information provided.  The family member/significant other agrees to remove the items of safety concern listed above.  Carmina MillerHorton, Ramy Greth Nicole 07/16/2013, 3:33 PM

## 2013-07-16 NOTE — Progress Notes (Signed)
Dr. Jama Flavorsobos made aware of all blood pressures and that the patient was asymptomatic and no orders received. Dr. Jama Flavorsobos made this writer aware that he would be consulting internal medicine

## 2013-07-16 NOTE — Progress Notes (Signed)
Adult Psychoeducational Group Note  Date:  07/16/2013 Time:  10:20 PM  Group Topic/Focus:  Wrap-Up Group:   The focus of this group is to help patients review their daily goal of treatment and discuss progress on daily workbooks.  Participation Level:  Minimal  Participation Quality:  Attentive  Affect:  Appropriate  Cognitive:  Alert  Insight: Good  Engagement in Group:  Resistant  Modes of Intervention:  Activity  Additional Comments:  PT was talking in group but was not much said  Pettress, Delton R 07/16/2013, 10:20 PM

## 2013-07-16 NOTE — Tx Team (Signed)
Interdisciplinary Treatment Plan Update (Adult)  Date: 07/16/2013  Time Reviewed:  9:45 AM  Progress in Treatment: Attending groups: Yes Participating in groups:  Yes Taking medication as prescribed:  Yes Tolerating medication:  Yes Family/Significant othe contact made: CSW assessing  Patient understands diagnosis:  Yes Discussing patient identified problems/goals with staff:  Yes Medical problems stabilized or resolved:  Yes Denies suicidal/homicidal ideation: Yes Issues/concerns per patient self-inventory:  Yes Other:  New problem(s) identified: N/A  Discharge Plan or Barriers: CSW assessing for appropriate referrals.  Reason for Continuation of Hospitalization: Anxiety Depression Medication Stabilization  Comments: N/A  Estimated length of stay: 3-5 days  For review of initial/current patient goals, please see plan of care.  Attendees: Patient:     Family:     Physician:  Dr. Jama Flavorsobos 07/16/2013 9:25 AM   Nursing:   Leighton ParodyBritney Tyson, RN 07/16/2013 9:25 AM   Clinical Social Worker:  Reyes Ivanhelsea Horton, LCSW 07/16/2013 9:25 AM   Other: Neill Loftarol Davis, RN 07/16/2013 9:25 AM   Other:  Quintella ReichertBeverly Knight, RN 07/16/2013 9:25 AM   Other:  Juline PatchQuylle Hodnett, LCSW 07/16/2013 9:25 AM   Other:  Piedad ClimesKelley Miller, Pharmacy resident 07/16/2013 9:29 AM   Other:    Other:    Other:    Other:    Other:     Scribe for Treatment Team:   Carmina MillerHorton, Karina Nofsinger Nicole, 07/16/2013 9:29 AM

## 2013-07-16 NOTE — [facility] Group Notes (Signed)
[facility] LCSW Group Therapy  07/16/2013 1:15 PM   Type of Therapy:  Group Therapy  Participation Level:  Did Not Attend - pt sleeping in his room  Ryan IvanChelsea Horton, LCSW 07/16/2013 2:27 PM
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
## 2013-07-16 NOTE — Progress Notes (Signed)
D: Patient denies SI/HI and A/V hallucinations; patient reports sleep is fair; reports appetite is good; reports energy level is normal ; reports ability to pay attention is improving; rates depression as 2/10; rates hopelessness 2/10  A: Monitored q 15 minutes; patient encouraged to attend groups; patient educated about medications; patient given medications per physician orders; patient encouraged to express feelings and/or concerns  R: Patient is appropriate to circumstances; patient is cooperative and has been in the bed this morning; patient's interaction with staff and peers is appropriate; patient was able to set goal to talk with staff 1:1 when having feelings of SI; patient is taking medications as prescribed and tolerating medications; patient is attending some groups
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
## 2013-07-16 NOTE — Progress Notes (Signed)
S: Patient with elevated BP per routine vital checks 194/114. Patient is denying any HA, diplopia, N/V, DOE, CP diaphoresis or palpitations. Patient with hx of Htn and medical non compliance. Positive smoker one PPD. Endorses positive family hx of Htn and CAD/MI i.e. Father in his 6040's  0: V/s T99.1 P71, R18 BP 194/114  K 3.2, creatinine 1.08, BUN 11, LFT's wnl  Gen: 38 y/o AAM alert and orientated x 3 NAD  Integument: W/D HEENT: fundoscopic with bilat red reflex noted, no papilledema, nicking or hemorrhage noted bilat Neck: supple pendulous without palpable goiter, or audible carotid bruits Pulm: decreased aeration without crackles, wheezes or rhonchi Cardio: Audible S1,S2 without M/G/R EXT: non edematous LE bilat, DP pulses 2/4     12 lead EKG: sinus brady, normal axis with hyper acute QRS complexes likely c/w LVH. Noted T wave inversion laterally V4-V6 with > 1cm ST depression INF/laterally. No acute change from prior study 05/30/2011.  A/P 1. Hypertensive urgency 2. Hypokalemia 3. Medical non compliance 4. Obesity 5. Tobacco abuse 6. 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

## 2013-07-16 NOTE — Progress Notes (Signed)
D: Pt is blank in affect and pleasant in mood. Pt is interested in going home soon. Pt denies any psychosocial symptoms. This includes no SI/HI/AVH. Pt did not attend group this evening. Pt observed with minimal interaction with others this evening.  A: Writer administered scheduled and prn medications to pt. Continued support and availability as needed was extended to this pt. Staff continue to monitor pt with q4515min checks.  R: No adverse drug reactions noted. Pt receptive to treatment. Pt remains safe at this time.
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
## 2013-07-16 NOTE — Progress Notes (Signed)
Patient ID: Ryan Horn, male   DOB: 04/10/1975, 38 y.o.   MRN: [id number]

## 2013-07-17 DIAGNOSIS — F3289 Other specified depressive episodes: Secondary | ICD-10-CM

## 2013-07-17 DIAGNOSIS — F329 Major depressive disorder, single episode, unspecified: Secondary | ICD-10-CM

## 2013-07-17 DIAGNOSIS — F1410 Cocaine abuse, uncomplicated: Secondary | ICD-10-CM

## 2013-07-17 LAB — LIPID PANEL
CHOL/HDL RATIO: 3.1 ratio
CHOLESTEROL: 153 mg/dL (ref 0–200)
HDL: 50 mg/dL (ref >39)
LDL CALC: 73 mg/dL (ref 0–99)
Triglycerides: 150 mg/dL — ABNORMAL HIGH (ref <150)
VLDL: 30 mg/dL (ref 0–40)

## 2013-07-17 LAB — BASIC METABOLIC PANEL
BUN: 12 mg/dL (ref 6–23)
CALCIUM: 9.6 mg/dL (ref 8.4–10.5)
CO2: 28 meq/L (ref 19–32)
CREATININE: 1.05 mg/dL (ref 0.50–1.35)
Chloride: 100 mEq/L (ref 96–112)
GFR calc Af Amer: >90 mL/min (ref >90)
GFR calc non Af Amer: 89 mL/min — ABNORMAL LOW (ref >90)
GLUCOSE: 108 mg/dL — AB (ref 70–99)
Potassium: 3.2 mEq/L — ABNORMAL LOW (ref 3.7–5.3)
Sodium: 143 mEq/L (ref 137–147)

## 2013-07-17 LAB — MAGNESIUM: Magnesium: 2 mg/dL (ref 1.5–2.5)

## 2013-07-17 LAB — HIV ANTIBODY (ROUTINE TESTING W REFLEX): HIV 1&2 Ab, 4th Generation: NONREACTIVE

## 2013-07-17 LAB — HEMOGLOBIN A1C
HEMOGLOBIN A1C: 5.3 % (ref <5.7)
Mean Plasma Glucose: 105 mg/dL (ref <117)

## 2013-07-17 LAB — TSH: TSH: 2.96 u[IU]/mL (ref 0.350–4.500)

## 2013-07-17 MED ORDER — AMLODIPINE BESYLATE 5 MG PO TABS
5.0000 mg | ORAL_TABLET | Freq: Every day | ORAL | Status: DC
  Administered 2013-07-17: 5 mg via ORAL
  Filled 2013-07-17 (×3): qty 1

## 2013-07-17 MED ORDER — HYDRALAZINE HCL 25 MG PO TABS
25.0000 mg | ORAL_TABLET | Freq: Three times a day (TID) | ORAL | Status: DC
  Administered 2013-07-18 (×3): 25 mg via ORAL
  Filled 2013-07-17 (×8): qty 1

## 2013-07-17 MED ORDER — POTASSIUM CHLORIDE CRYS ER 20 MEQ PO TBCR
40.0000 meq | EXTENDED_RELEASE_TABLET | Freq: Once | ORAL | Status: AC
  Administered 2013-07-17: 40 meq via ORAL

## 2013-07-17 MED ORDER — POTASSIUM CHLORIDE CRYS ER 20 MEQ PO TBCR
EXTENDED_RELEASE_TABLET | ORAL | Status: AC
  Administered 2013-07-17: 40 meq via ORAL
  Filled 2013-07-17: qty 2

## 2013-07-17 MED ORDER — POTASSIUM CHLORIDE CRYS ER 20 MEQ PO TBCR
20.0000 meq | EXTENDED_RELEASE_TABLET | Freq: Two times a day (BID) | ORAL | Status: DC
  Administered 2013-07-17 – 2013-07-18 (×2): 20 meq via ORAL
  Filled 2013-07-17: qty 2
  Filled 2013-07-17 (×4): qty 1

## 2013-07-17 NOTE — [facility] Group Notes (Signed)
[facility] LCSW Group Therapy  07/17/2013  1:15 PM   Type of Therapy:  Group Therapy  Participation Level:  Minimal  Participation Quality:  Minimal, Drowsy  Affect:  Drowsy  Cognitive:  Inattentive, Drowsy  Insight:  Limited, lacking  Engagement in Therapy:  Limited, lacking  Modes of Intervention:  Clarification, Confrontation, Discussion, Education, Exploration, Limit-setting, Orientation, Problem-solving, Rapport Building, Dance movement psychotherapisteality Testing, Socialization and Support  Summary of Progress/Problems: The topic for group today was emotional regulation.  This group focused on both positive and negative emotion identification and allowed group members to process ways to identify feelings, regulate negative emotions, and find healthy ways to manage internal/external emotions. Group members were asked to reflect on a time when their reaction to an emotion led to a negative outcome and explored how alternative responses using emotion regulation would have benefited them. Group members were also asked to discuss a time when emotion regulation was utilized when a negative emotion was experienced.  Pt slept for the duration of group and did not participate or listen to group discussion.    Ryan IvanChelsea Horton, LCSW 07/17/2013  2:36 PM
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
## 2013-07-17 NOTE — Progress Notes (Signed)
Adult Psychoeducational Group Note  Date:  07/17/2013 Time:  5:08 PM  Group Topic/Focus:  Personal Choices and Values:   The focus of this group is to help patients assess and explore the importance of values in their lives, how their values affect their decisions, how they express their values and what opposes their expression.  Participation Level:  Active  Participation Quality:  Resistant and slow to respond to questions  Affect:  Blunted and Flat  Engagement in Group:  Lacking  Modes of Intervention:  Discussion, Education and Support  Additional Comments:  Pt identified "growing as a person" as an important value.  Reynolds BowlClement, Roshawna Colclasure D 07/17/2013, 5:08 PM

## 2013-07-17 NOTE — [facility] Group Notes (Signed)
[facility]   07/17/2013 8:45 AM  Participation Quality:  Drowsy, Minimal  Mood/Affect:  Drowsy, flat and depressed  Depression Rating:  5  Anxiety Rating:  5  Thoughts of Suicide:  Pt denies SI/HI  Will you contract for safety?   Yes  Current AVH:  Pt denies  Plan for Discharge/Comments:  Pt attended discharge planning group and actively participated in group.  CSW provided pt with today's workbook.  Pt reports feeling okay today but tired.  Pt was observed to be sleeping throughout group and only responds when called upon, to wake up.  Pt states that [place] to [place] to [place] at d/c where his son and where he has additional support.  CSW will assess for appropriate referrals as pt becomes more stable.  No further needs voiced by pt at this time.    Transportation Means: Pt reports access to transportation - pt states that he has money to [place] greyhound [place]  Supports: No supports mentioned at this time  Reyes IvanChelsea Horton, LCSW 07/17/2013 9:46 AM

## 2013-07-17 NOTE — Progress Notes (Signed)
Adult Psychoeducational Group Note  Date:  07/17/2013 Time:  8:59 PM  Group Topic/Focus:  Wrap-Up Group:   The focus of this group is to help patients review their daily goal of treatment and discuss progress on daily workbooks.  Participation Level:  Active  Participation Quality:  Appropriate  Affect:  Appropriate  Cognitive:  Appropriate  Insight: Appropriate  Engagement in Group:  Engaged  Modes of Intervention:  Support  Additional Comments:  Pt stated that positive thing that happened today is that he met a lot of new people and that he has been enjoying his time with his peers. Pt was given support from writer to keep socializing and the benefits of doing so.   Ciceron, Ritchie 07/17/2013, 8:59 PM

## 2013-07-17 NOTE — Progress Notes (Signed)
Adult Activity Group Note  Date: 07/17/13 Time:10am  Group Topic: Art  Participation Level: None  Participation Quality: Drowsy and Inattentive  Affect:  Flat and Lethargic  Activity: Patients asked to create art to coincide with daily unit theme using any combination of markers, crayons, color pencils, construction paper, magazine clippings, glue, and scissors.   Additional Comments: Pts explored summertime and the activities they would like to explore when they leave the hospital. Pts created a list on poster board.
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
## 2013-07-17 NOTE — Progress Notes (Signed)
D: Patient denies SI/HI and A/V hallucinations; patient reports sleep is fair; reports appetite is improving; reports energy level is normal ; reports ability to pay attention is improving; rates depression as 5/10; rates hopelessness 5/10; patient having complaints in buttocks area;   A: Monitored q 15 minutes; patient encouraged to attend groups; patient educated about medications; patient given medications per physician orders; patient encouraged to express feelings and/or concerns; patient provided wound care and he did have some drainage and wound consult nurse made aware  R: Patient is pleasant and cooperative; patient reports relief of pain after pain medication; patient's interaction with staff and peers is appropriate; patient was able to set goal to talk with staff 1:1 when having feelings of SI; patient is taking medications as prescribed and tolerating medications; patient is attending all groups
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
## 2013-07-18 DIAGNOSIS — F332 Major depressive disorder, recurrent severe without psychotic features: Principal | ICD-10-CM

## 2013-07-18 DIAGNOSIS — F4310 Post-traumatic stress disorder, unspecified: Secondary | ICD-10-CM

## 2013-07-18 LAB — BASIC METABOLIC PANEL
BUN: 12 mg/dL (ref 6–23)
CO2: 26 mEq/L (ref 19–32)
Calcium: 9.3 mg/dL (ref 8.4–10.5)
Chloride: 103 mEq/L (ref 96–112)
Creatinine, Ser: 1.03 mg/dL (ref 0.50–1.35)
GFR calc Af Amer: >90 mL/min (ref >90)
GFR calc non Af Amer: >90 mL/min (ref >90)
Glucose, Bld: 90 mg/dL (ref 70–99)
Potassium: 3.8 mEq/L (ref 3.7–5.3)
Sodium: 143 mEq/L (ref 137–147)

## 2013-07-18 LAB — VALPROIC ACID LEVEL: Valproic Acid Lvl: 55.7 ug/mL (ref 50.0–100.0)

## 2013-07-18 MED ORDER — SERTRALINE HCL 50 MG PO TABS: 50.0000 mg | ORAL_TABLET | Freq: Every day | ORAL | Status: DC

## 2013-07-18 MED ORDER — OXYCODONE-ACETAMINOPHEN 5-325 MG PO TABS
2.0000 | ORAL_TABLET | Freq: Once | ORAL | Status: AC
Start: 2013-07-18 — End: 2013-07-18
  Administered 2013-07-18: 2 via ORAL

## 2013-07-18 MED ORDER — CLONIDINE HCL 0.1 MG/24HR TD PTWK: 0.1000 mg | MEDICATED_PATCH | TRANSDERMAL | Status: DC

## 2013-07-18 MED ORDER — POTASSIUM CHLORIDE ER 10 MEQ PO TBCR: 10.0000 meq | EXTENDED_RELEASE_TABLET | Freq: Every day | ORAL | Status: DC

## 2013-07-18 MED ORDER — OXYCODONE-ACETAMINOPHEN 5-325 MG PO TABS
ORAL_TABLET | ORAL | Status: AC
  Administered 2013-07-18: 11:00:00
  Filled 2013-07-18: qty 2

## 2013-07-18 MED ORDER — ARIPIPRAZOLE 10 MG PO TABS: 10.0000 mg | ORAL_TABLET | Freq: Every day | ORAL | Status: DC

## 2013-07-18 MED ORDER — LORAZEPAM 1 MG PO TABS
ORAL_TABLET | ORAL | Status: AC
  Filled 2013-07-18: qty 1

## 2013-07-18 MED ORDER — SILVER SULFADIAZINE 1 % EX CREA: 1.0000 "application " | TOPICAL_CREAM | Freq: Every day | CUTANEOUS | Status: DC

## 2013-07-18 MED ORDER — LISINOPRIL 20 MG PO TABS: 20.0000 mg | ORAL_TABLET | Freq: Every day | ORAL | Status: DC

## 2013-07-18 MED ORDER — HYDROCHLOROTHIAZIDE 25 MG PO TABS: 25.0000 mg | ORAL_TABLET | Freq: Every day | ORAL | Status: DC

## 2013-07-18 MED ORDER — DIVALPROEX SODIUM 500 MG PO DR TAB: 500.0000 mg | DELAYED_RELEASE_TABLET | Freq: Two times a day (BID) | ORAL | Status: DC

## 2013-07-18 MED ORDER — HYDRALAZINE HCL 25 MG PO TABS: 25.0000 mg | ORAL_TABLET | Freq: Three times a day (TID) | ORAL | Status: DC

## 2013-07-18 NOTE — Progress Notes (Signed)
The focus of this group is to educate the patient on the purpose and policies of crisis stabilization and provide a format to answer questions about their admission.  The group details unit policies and expectations of patients while admitted.  Patient attended 0900 nurse education orientation group this morning.  Patient actively participated, appropriate affect, alert, appropriate insight and engagement.  Today patient will work on 3 goals for discharge.  

## 2013-07-18 NOTE — Progress Notes (Signed)
D: Pt is flat in affect, but brightens upon approach. Pt is observed with active participation within the milieu. Pt appropriately interacts with others. Pt reports effectiveness in the percocets controlling his back pain. Pt is currently denying any SI/HI/AVH. Pt continues to trend with a high Bp. Pt was asymptomatic.  A: Writer administered scheduled and prn medications to pt. Pt was informed of his increased dose of Hydralazine that is to begin in the morning. Continued support and availability as needed was extended to this pt. Staff continue to monitor pt with q5215min checks.  R: No adverse drug reactions noted. Pt receptive to treatment. Pt remains safe at this time.

## 2013-07-18 NOTE — Progress Notes (Signed)
[facility] :  Will you be returning to the same living situation after discharge: Yes,  pt was homeless prior to admission and will continue to be homeless at d/c.  Pt verbalizes a plan to stay in a hotel for a few days and than [place] [facility] where he has family support.  Pt plans to financially support this plan by pan handling today.  Pt familiar with the shelter and resources in the community. At discharge, do you have transportation home?:Yes,  CSW provided pt with a bus pass Do you have the ability to pay for your medications:Yes,  provided pt with samples and prescriptions and [facility] with affording meds.  Release of information consent forms completed and in the chart;  Patient's signature needed at discharge.  Patient to Follow up at: Follow-up Information   Follow up with [facility] On 07/19/2013. (Walk in on this date for hospital discharge appointment. Walk in clinic is Monday - Friday 8 am - 3 pm. They will than schedule you for medication management and therapy. )    Contact information:   [address] [place], [place] [address] Phone: [phone] Fax: [phone]      [facility] with [facility]. (Call to schedule follow up for medical needs)    Contact information:   [address], [place], [place] [address] [phone]      Patient denies SI/HI:   Yes,  denies SI/HI    Safety Planning and Suicide Prevention discussed:  Yes,  discussed with pt and pt's sister.  See suicide prevention education note.   Carmina MillerHorton, Leda Bellefeuille Nicole 07/18/2013, 10:11 AM
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
## 2013-07-18 NOTE — Progress Notes (Signed)
Adult Psychoeducational Group Note  Date:  07/18/2013 Time:  11:16 AM  Group Topic/Focus:  Overcoming Stress:   The focus of this group is to define stress and help patients assess their triggers.  Participation Level:  Did Not Attend   [name], Ryan D 07/18/2013, 11:16 AM
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
## 2013-07-20 ENCOUNTER — Emergency Department (HOSPITAL_COMMUNITY)
Admission: EM | Admit: 2013-07-20 | Discharge: 2013-07-20 | Disposition: A | Discharge status: Home / Self Care | Payer: No Typology Code available for payment source | Attending: Emergency Medicine | Admitting: Emergency Medicine

## 2013-07-20 ENCOUNTER — Encounter (HOSPITAL_COMMUNITY): Payer: Self-pay | Admitting: Emergency Medicine

## 2013-07-20 DIAGNOSIS — T300 Burn of unspecified body region, unspecified degree: Secondary | ICD-10-CM

## 2013-07-20 DIAGNOSIS — F17200 Nicotine dependence, unspecified, uncomplicated: Secondary | ICD-10-CM | POA: Insufficient documentation

## 2013-07-20 DIAGNOSIS — F209 Schizophrenia, unspecified: Secondary | ICD-10-CM | POA: Insufficient documentation

## 2013-07-20 DIAGNOSIS — F319 Bipolar disorder, unspecified: Secondary | ICD-10-CM | POA: Insufficient documentation

## 2013-07-20 DIAGNOSIS — G8929 Other chronic pain: Secondary | ICD-10-CM | POA: Insufficient documentation

## 2013-07-20 DIAGNOSIS — E669 Obesity, unspecified: Secondary | ICD-10-CM | POA: Insufficient documentation

## 2013-07-20 DIAGNOSIS — T2104XA Burn of unspecified degree of lower back, initial encounter: Secondary | ICD-10-CM | POA: Insufficient documentation

## 2013-07-20 DIAGNOSIS — I10 Essential (primary) hypertension: Secondary | ICD-10-CM | POA: Insufficient documentation
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
## 2013-07-20 NOTE — Discharge Instructions (Signed)
Burn Care Your skin is a natural barrier to infection. It is the largest organ of your body. Burns damage this natural protection. To help prevent infection, it is very important to follow your caregiver's instructions in the care of your burn. Burns are classified as:  First degree. There is only redness of the skin (erythema). No scarring is expected.  Second degree. There is blistering of the skin. Scarring may occur with deeper burns.  Third degree. All layers of the skin are injured, and scarring is expected. HOME CARE INSTRUCTIONS   Wash your hands well before changing your bandage.  Change your bandage as often as directed by your caregiver.  Remove the old bandage. If the bandage sticks, you may soak it off with cool, clean water.  Cleanse the burn thoroughly but gently with mild soap and water.  Pat the area dry with a clean, dry cloth.  Apply a thin layer of antibacterial cream to the burn.  Apply a clean bandage as instructed by your caregiver.  Keep the bandage as clean and dry as possible.  Elevate the affected area for the first 24 hours, then as instructed by your caregiver.  Only take over-the-counter or prescription medicines for pain, discomfort, or fever as directed by your caregiver. SEEK IMMEDIATE MEDICAL CARE IF:   You develop excessive pain.  You develop redness, tenderness, swelling, or red streaks near the burn.  The burned area develops yellowish-white fluid (pus) or a bad smell.  You have a fever. MAKE SURE YOU:   Understand these instructions.  Will watch your condition.  Will get help right away if you are not doing well or get worse. Document Released: 01/17/2005 Document Revised: 04/11/2011 Document Reviewed: 06/09/2010 [facility] Patient Information 2015 [employer], [place]. This information is not intended to replace advice given to you by your health care provider. Make sure you discuss any questions you have with your health care  provider.   Emergency Department Resource Guide 1) Find a Doctor and Pay Out of Pocket Although you won't have to find out who is covered by your insurance plan, it is a good idea to ask around and get recommendations. You will then need to call the office and see if the doctor you have chosen will accept you as a new patient and what types of options they offer for patients who are self-pay. Some doctors offer discounts or will set up payment plans for their patients who do not have insurance, but you will need to ask so you aren't surprised when you get to your appointment.  2) Contact Your Local Health Department Not all health departments have doctors that can see patients for sick visits, but many do, so it is worth a call to see if yours does. If you don't know where your local health department is, you can check in your phone book. The CDC also has a tool to help you locate your state's health department, and many state websites also have listings of all of their local health departments.  3) Find a Walk-in Clinic If your illness is not likely to be very severe or complicated, you may want to try a walk in clinic. These are popping up all over the country in pharmacies, drugstores, and shopping centers. They're usually staffed by nurse practitioners or physician assistants that have been trained to treat common illnesses and complaints. They're usually fairly quick and inexpensive. However, if you have serious medical issues or chronic medical problems, these are probably not  your best option.  No Primary Care Doctor: - Call Health Connect at  [phone] - they can help you locate a primary care doctor that  accepts your insurance, provides certain services, etc. - Physician Referral Service- [phone]  Chronic Pain Problems: Organization         Address  Phone   Notes  [facility]  [phone] Patients need to be referred by their primary care doctor.    Medication Assistance: Organization         Address  Phone   Notes  [facility] Medication [facility] [address] [place], [place] [address] [phone]) [phone] --Must be a resident of [facility] -- Must have NO insurance coverage whatsoever (no Medicaid/ Medicare, etc.) -- The pt. MUST have a primary care doctor that directs their care regularly and follows them in the community   MedAssist  [phone]) [phone]   [employer]  [phone]    Agencies that provide inexpensive medical care: Organization         Address  Phone   Notes  [facility]  [phone]) [phone]   [facility]    [phone]   [facility] [address] [place], [place] [address] [phone]   [facility] [place] [address], [place] [phone]   Planned Parenthood    [phone](336) [phone]   [facility]    [phone]   [facility]  [address], [place] Phone:  [phone], Fax:  [phone] Hours of Operation:  9 am - 6 pm, M-F.  Also accepts Medicaid/Medicare and self-pay.  [facility]  [address], [place] Phone: [phone], Fax: [phone]. Hours of Operation:  8:30 am - 5:30 pm, M-F.  Also accepts Medicaid and self-pay.  [facility] [place] [address], [place] Phone: [phone]) [phone]   [facility] [address], [place], [place] [phone], Ext. [phone] Mondays & Thursdays: 7-9 AM.  First 15 patients are seen on a first come, first serve basis.    Medicaid-accepting [facility] Providers:  Organization         Address  Phone   Notes  [facility] [address], [place] [phone] Also accepts self-pay patients.  [facility] [address], [place]  [phone]   [facility] [address], [place] [phone]) [phone]   [facility] [address] [place] [address], [place] [phone]   Renaye RakersVeita Bland [address], [place]   [phone] Only accepts [place] Access [place] patients after they have their name applied to their card.   Self-Pay (no insurance) in [facility] [place]:  Organization         Address  Phone   Notes  Sickle Cell Patients, [facility] [address], [place] [phone]   [facility] [address], [place] [phone]   [facility] [place]  [address], [place] [phone]   [facility]/Dr. Osei-Bonsu  [address] [place] [address], [place] or [phone], [place] [phone] Phone number for both [place] and [place] locations is the same.  [facility] [address], [place] [phone]   [facility] [place] [address] [place] [address], [place] or [address] [phone]   [facility] [address], [place] [phone]) [phone], phone; [phone], fax Sees patients 1st and 3rd Saturday of every month.  Must not qualify for public or private insurance (i.e. Medicaid, Medicare, [place] Health Choice, Veterans' Benefits)  Household income should be no more than 200% of the poverty level The clinic cannot treat you if you are pregnant or think you are pregnant  Sexually transmitted diseases are not treated at the clinic.    Dental Care: Organization         Address  Phone  Notes  [facility] of [facility] [address], [place] [phone]) [phone] Accepts children up to age 38 who are enrolled in [place]icaid or [place] Health Choice; pregnant women with a Medicaid card; and children who have applied for Medicaid or [place] Health Choice, but were declined, whose parents can pay a reduced fee at time of service.   [facility] of [facility] [place]  [address], [place] [phone] Accepts children up to age 38 who are enrolled in [place]icaid or [place] Health Choice; pregnant women with a Medicaid card; and children who have applied for Medicaid or  Health Choice, but were declined, whose parents can pay a reduced fee at time of service.  [facility]  [address], [place] [phone]) [phone] Patients are seen by appointment only. Walk-ins are not accepted. [facility] will see patients 38 years of age and older. Monday - Tuesday (8am-5pm) Most Wednesdays (8:30-5pm) $30 per visit, cash only  [facility]  [address], [facility] [phone] Patients are seen by appointment only. Walk-ins are not accepted. [facility] will see patients 38 years of age and older. One Wednesday Evening (Monthly: Volunteer Based).  $30 per visit, cash only  [facility]  [phone] for adults; Children under age 594, call Graduate Pediatric Dentistry at [phone]. Children aged 994-14, please call [phone] to request a pediatric application.  Dental services are provided in all areas of dental care including fillings, crowns and bridges, complete and partial dentures, implants, gum treatment, root canals, and extractions. Preventive care is also provided. Treatment is provided to both adults and children. Patients are selected via a lottery and there is often a waiting list.   [facility] [address], [place]  [phone] [email or web address]   [facility] [address], [place], [place] [phone], Ext. [phone] Second and Fourth Thursday of each month, opens at 6:30 AM; Clinic ends at 9 AM.  Patients are seen on a first-come first-served basis, and a limited number are seen during each clinic.   [facility]  [address], [place], [place] [phone])  [phone]   Eligibility Requirements You must have lived in [place], [place], or [place] counties for at least the last three months.   You cannot be eligible for state or federal sponsored National Cityhealthcare insurance, including CIGNAVeterans Administration, [place]icaid, or Har[employer] EntertainmentMedicare.   You generally cannot be eligible for healthcare insurance through your employer.    How to apply: Eligibility screenings are held every Tuesday and Wednesday afternoon from 1:00 pm until 4:00 pm. You do not need an appointment for the interview!  [facility] [address] [place]  Barbara Cower [place], [place]   [place]  [place] Department  [place]  [phone]    Behavioral Health Resources in the Community: Intensive Outpatient Programs Organization         Address  Phone  Notes  [place]. [address] [place], [place] [phone]   [facility]patient [address] [place], [place]   ADS: Alcohol & Drug Svcs [address], [place], [place]   [place] [address],  [place], [place] or [phone]   Substance Abuse Resources Organization         Address  Phone  Notes  Alcohol and Drug Services  [phone]   [place]  [phone]   [facility] [place]   Chinita Pester  [phone]   Residential & Outpatient Substance Abuse Program  [phone]   Psychological Services Organization         Address  Phone  Notes  [facility] [place]  [place]  [phone]   [place] [address], [place] or [phone]    Mobile Crisis Teams Organization         Address  Phone  Notes  [facility], Mobile Crisis Care Unit  [phone]   Assertive Psychotherapeutic Services  [address] [place], [place]   [place] Levels [address], [place] [phone]    Self-Help/Support Groups Organization         Address  Phone             Notes  [place]. of [place] - variety of support groups  Chauncey Call for more information  Narcotics Anonymous (NA), Caring Services [address], [employer] [place]  2 meetings at this location   Special educational needs teacher         Address  Phone  Notes  [facility]AP Residential Treatment [place],    [place]  [phone]   [facility]  [address], [place] 315945, [place], [place]   [place], [place] [phone] Admissions: 8am-3pm M-F  Incentives Substance [place] [address]    [place], [place] [phone]   [facility] [address] [place], [place], [place]   [facility] [address]  [place], [place]   [facility]ight Programs - Intensive Outpatient [address], [place], [place]   [facility] ([place].) [place].,  [place], [place] [phone] or [phone]   [facility] Services (RTS) [address] [place], [place] Accepts Medicaid  [facility] [place] [address]  [place] [phone] Substance Abuse/Addiction Treatment   [facility] Organization         Address  Phone  Notes  [facility]  [phone]   Domenic Schwab, PhD [address] Arlis Porta [place], [place]   [phone] or [phone]   [place], [place] [phone]   [place] [address], [place], [place] [phone] Insurance/Medicaid/sponsorship through [employer] and Families [address] HFG [address]  [place], [place] [phone]  Therapy/tele-psych/case  °[facility] [address]  ° [place],  [phone]    °Dr. Arfeen  [phone]   °[facility] [place]  [employer] [place] [facility] 1) [address], [place] °2) [address], [place] °3)  [address], [place] [phone] °([phone] ° °([phone]   °[place] Child Abuse Hotline [phone]) 342-1394 or [phone] (After Hours)    ° ° ° °

## 2013-07-20 NOTE — ED Provider Notes (Signed)
Medical screening examination/treatment/procedure(s) were performed by non-physician practitioner and as supervising physician I was immediately available for consultation/collaboration.   EKG Interpretation None        Layla MawKristen N Ward, DO 07/20/13 1226

## 2013-07-23 NOTE — Progress Notes (Signed)
Patient Discharge Instructions:  After Visit Summary (AVS):   Faxed to:  07/23/13 Psychiatric Admission Assessment Note:   Faxed to:  07/23/13 Suicide Risk Assessment - Discharge Assessment:   Faxed to:  07/23/13 Faxed/Sent to the Next Level Care provider:  07/23/13 Next Level Care Provider Has Access to the EMR, 07/23/13 [facility]axed to [facility] @ [phone] Records provided to [facility] via CHL/Epic access. Jerelene ReddenSheena E [place], 07/23/2013, 2:21 PM

## 2013-07-27 ENCOUNTER — Emergency Department (HOSPITAL_COMMUNITY)
Admission: EM | Admit: 2013-07-27 | Discharge: 2013-07-29 | Disposition: A | Discharge status: Home / Self Care | Payer: No Typology Code available for payment source | Attending: Emergency Medicine | Admitting: Emergency Medicine

## 2013-07-27 ENCOUNTER — Encounter (HOSPITAL_COMMUNITY): Payer: Self-pay | Admitting: Emergency Medicine

## 2013-07-27 DIAGNOSIS — L02419 Cutaneous abscess of limb, unspecified: Secondary | ICD-10-CM | POA: Insufficient documentation

## 2013-07-27 DIAGNOSIS — L02416 Cutaneous abscess of left lower limb: Secondary | ICD-10-CM

## 2013-07-27 DIAGNOSIS — R45851 Suicidal ideations: Secondary | ICD-10-CM | POA: Insufficient documentation

## 2013-07-27 DIAGNOSIS — F29 Unspecified psychosis not due to a substance or known physiological condition: Secondary | ICD-10-CM | POA: Insufficient documentation

## 2013-07-27 DIAGNOSIS — Z79899 Other long term (current) drug therapy: Secondary | ICD-10-CM | POA: Insufficient documentation

## 2013-07-27 DIAGNOSIS — E876 Hypokalemia: Secondary | ICD-10-CM

## 2013-07-27 DIAGNOSIS — F1410 Cocaine abuse, uncomplicated: Secondary | ICD-10-CM | POA: Insufficient documentation

## 2013-07-27 DIAGNOSIS — I10 Essential (primary) hypertension: Secondary | ICD-10-CM | POA: Insufficient documentation

## 2013-07-27 DIAGNOSIS — F1994 Other psychoactive substance use, unspecified with psychoactive substance-induced mood disorder: Secondary | ICD-10-CM

## 2013-07-27 DIAGNOSIS — G8929 Other chronic pain: Secondary | ICD-10-CM | POA: Insufficient documentation

## 2013-07-27 DIAGNOSIS — F319 Bipolar disorder, unspecified: Secondary | ICD-10-CM | POA: Insufficient documentation

## 2013-07-27 DIAGNOSIS — E669 Obesity, unspecified: Secondary | ICD-10-CM | POA: Insufficient documentation

## 2013-07-27 DIAGNOSIS — L03119 Cellulitis of unspecified part of limb: Principal | ICD-10-CM

## 2013-07-27 DIAGNOSIS — F310 Bipolar disorder, current episode hypomanic: Secondary | ICD-10-CM

## 2013-07-27 DIAGNOSIS — F17200 Nicotine dependence, unspecified, uncomplicated: Secondary | ICD-10-CM | POA: Insufficient documentation

## 2013-07-27 DIAGNOSIS — F209 Schizophrenia, unspecified: Secondary | ICD-10-CM | POA: Insufficient documentation

## 2013-07-27 LAB — I-STAT CHEM 8, ED
BUN: 5 mg/dL — ABNORMAL LOW (ref 6–23)
CALCIUM ION: 1.13 mmol/L (ref 1.12–1.23)
Chloride: 103 mEq/L (ref 96–112)
Creatinine, Ser: 1.1 mg/dL (ref 0.50–1.35)
Glucose, Bld: 87 mg/dL (ref 70–99)
HCT: 43 % (ref 39.0–52.0)
HEMOGLOBIN: 14.6 g/dL (ref 13.0–17.0)
Potassium: 3 mEq/L — ABNORMAL LOW (ref 3.7–5.3)
SODIUM: 144 meq/L (ref 137–147)
TCO2: 29 mmol/L (ref 0–100)

## 2013-07-27 LAB — CBC WITH DIFFERENTIAL/PLATELET
BASOS ABS: 0 10*3/uL (ref 0.0–0.1)
Basophils Relative: 0 % (ref 0–1)
EOS PCT: 2 % (ref 0–5)
Eosinophils Absolute: 0.1 10*3/uL (ref 0.0–0.7)
HCT: 38.4 % — ABNORMAL LOW (ref 39.0–52.0)
Hemoglobin: 12.8 g/dL — ABNORMAL LOW (ref 13.0–17.0)
LYMPHS ABS: 2.3 10*3/uL (ref 0.7–4.0)
Lymphocytes Relative: 27 % (ref 12–46)
MCH: 30.3 pg (ref 26.0–34.0)
MCHC: 33.3 g/dL (ref 30.0–36.0)
MCV: 90.8 fL (ref 78.0–100.0)
Monocytes Absolute: 0.5 10*3/uL (ref 0.1–1.0)
Monocytes Relative: 6 % (ref 3–12)
NEUTROS PCT: 65 % (ref 43–77)
Neutro Abs: 5.4 10*3/uL (ref 1.7–7.7)
PLATELETS: 296 10*3/uL (ref 150–400)
RBC: 4.23 MIL/uL (ref 4.22–5.81)
RDW: 12.8 % (ref 11.5–15.5)
WBC: 8.4 10*3/uL (ref 4.0–10.5)

## 2013-07-27 LAB — RAPID URINE DRUG SCREEN, HOSP PERFORMED
Amphetamines: NOT DETECTED
Barbiturates: NOT DETECTED
Benzodiazepines: NOT DETECTED
COCAINE: POSITIVE — AB
OPIATES: NOT DETECTED
TETRAHYDROCANNABINOL: NOT DETECTED

## 2013-07-27 LAB — ACETAMINOPHEN LEVEL

## 2013-07-27 LAB — ETHANOL: Alcohol, Ethyl (B): <11 mg/dL (ref 0–11)

## 2013-07-27 LAB — SALICYLATE LEVEL

## 2013-07-27 MED ORDER — OXYCODONE-ACETAMINOPHEN 5-325 MG PO TABS
1.0000 | ORAL_TABLET | Freq: Once | ORAL | Status: AC
  Administered 2013-07-27: 1 via ORAL
  Filled 2013-07-27: qty 1

## 2013-07-27 MED ORDER — HYDROCODONE-ACETAMINOPHEN 5-325 MG PO TABS: 1.0000 | ORAL_TABLET | Freq: Once | ORAL | Status: DC

## 2013-07-27 MED ORDER — LORAZEPAM 1 MG PO TABS
1.0000 mg | ORAL_TABLET | Freq: Three times a day (TID) | ORAL | Status: DC | PRN
Start: 2013-07-27 — End: 2013-07-29

## 2013-07-27 MED ORDER — CLONIDINE HCL 0.2 MG PO TABS
0.2000 mg | ORAL_TABLET | Freq: Once | ORAL | Status: AC
  Administered 2013-07-27: 0.2 mg via ORAL
  Filled 2013-07-27: qty 1

## 2013-07-27 MED ORDER — HYDROCHLOROTHIAZIDE 25 MG PO TABS
25.0000 mg | ORAL_TABLET | Freq: Every day | ORAL | Status: DC
  Administered 2013-07-27 – 2013-07-29 (×3): 25 mg via ORAL
  Filled 2013-07-27 (×3): qty 1

## 2013-07-27 MED ORDER — ONDANSETRON HCL 4 MG PO TABS: 4.0000 mg | ORAL_TABLET | Freq: Three times a day (TID) | ORAL | Status: DC | PRN

## 2013-07-27 MED ORDER — POTASSIUM CHLORIDE ER 10 MEQ PO TBCR
10.0000 meq | EXTENDED_RELEASE_TABLET | Freq: Every day | ORAL | Status: DC
  Administered 2013-07-27 – 2013-07-29 (×3): 10 meq via ORAL
  Filled 2013-07-27 (×3): qty 1

## 2013-07-27 MED ORDER — DIVALPROEX SODIUM 250 MG PO DR TAB
500.0000 mg | DELAYED_RELEASE_TABLET | Freq: Two times a day (BID) | ORAL | Status: DC
  Administered 2013-07-27 – 2013-07-29 (×5): 500 mg via ORAL
  Filled 2013-07-27 (×5): qty 2

## 2013-07-27 MED ORDER — LISINOPRIL 20 MG PO TABS
20.0000 mg | ORAL_TABLET | Freq: Every day | ORAL | Status: DC
  Administered 2013-07-27 – 2013-07-29 (×3): 20 mg via ORAL
  Filled 2013-07-27 (×3): qty 1

## 2013-07-27 MED ORDER — POTASSIUM CHLORIDE CRYS ER 20 MEQ PO TBCR
40.0000 meq | EXTENDED_RELEASE_TABLET | Freq: Once | ORAL | Status: AC
  Administered 2013-07-27: 40 meq via ORAL
  Filled 2013-07-27: qty 2

## 2013-07-27 MED ORDER — OXYCODONE-ACETAMINOPHEN 5-325 MG PO TABS
1.0000 | ORAL_TABLET | Freq: Once | ORAL | Status: AC
  Administered 2013-07-27: 1 via ORAL
  Filled 2013-07-27 (×2): qty 1
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
## 2013-07-27 NOTE — ED Notes (Addendum)
Pt denies SI/HI at the time. Pt states he is homeless and does not feel safe. Pt to be placed on recommended voluntary psych hold per EDP. Security called to wand patient.

## 2013-07-27 NOTE — ED Notes (Signed)
Pt. Given graham crackers, peanut butter, and ginger ale.

## 2013-07-27 NOTE — BH Assessment (Signed)
[facility] Assessment Progress Note  Called and scheduled pt's tele assessment with this clinician for 0730 with Eli HoseBecky, [facility] secretary @ [phone].  Also, called Hoyt KochDp Zavitz @ [phone] for clinical information on the pt.  Pt to be seen via tele assessment by this clinician.  Casimer LaniusKristen Butler, MS, [facility] Licensed Professional Counselor Triage Specialist

## 2013-07-27 NOTE — ED Notes (Signed)
Pt. reports multiple small abscesses at left buttocks onset yesterday , denies drainage , no fever or chills.

## 2013-07-27 NOTE — ED Notes (Signed)
Pt sleeping in lobby 
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
## 2013-07-28 MED ORDER — ZOLPIDEM TARTRATE 5 MG PO TABS
5.0000 mg | ORAL_TABLET | Freq: Every evening | ORAL | Status: DC | PRN
  Administered 2013-07-28: 5 mg via ORAL
  Filled 2013-07-28: qty 1

## 2013-07-28 MED ORDER — IBUPROFEN 400 MG PO TABS
600.0000 mg | ORAL_TABLET | Freq: Once | ORAL | Status: AC
  Administered 2013-07-28: 600 mg via ORAL
  Filled 2013-07-28 (×2): qty 1

## 2013-07-28 NOTE — ED Notes (Signed)
Reported that patient has tylenol as allergy to Dr. Lavella LemonsManly.  MD gives verbal order for Motrin 600mg  for pain management.

## 2013-07-28 NOTE — ED Notes (Signed)
Reported to Dr. Fredderick PhenixBelfi that patient requested ambien for sleeping. MD acknowledges, no verbal orders at this time.

## 2013-07-28 NOTE — Progress Notes (Signed)
Alvia GroveBrynn Marr: Referral Faxed [facility]: Referral Faxed  [facility] Disposition Tech

## 2013-07-28 NOTE — BH Assessment (Addendum)
Called to request TA equipment be moved to room for reassessment. Equipment is currently in use, and Pt's RN is off unit, and unable to provide Pt update at this time. [facility] desktop when equipment is with Pt for reassessment.   Called to check on status of equipment for reassessment at 2230 and equipment is still unavailable. Everlena CooperMica, RN will call this Clinical research associatewriter when equipment is available.   Clista BernhardtNancy Stephenson, [facility] Triage Specialist 07/28/2013 9:51 PM

## 2013-07-28 NOTE — BH Assessment (Signed)
Per Kelly Southard, AC at [facility], adult unit is currently at capacity. Contacted the following facilities for placement:   AT CAPACITY:  [facility], per Patsy  [facility], per Jennifer  [facility], Per Teresa  [facility], Per Amanda  [facility], per Jessica  [facility], Per Robyn  [facility], per Pat  [facility], Per Robyn  [facility], per David  [facility], per Wanda  [facility], per Jennifer  [facility], per Christine  [facility], per Regina  [facility], per Amanda [facility], per Karen  [facility], per Chelsea   Ford Ellis Warrick Jr, LPC, [facility] Triage Specialist [phone]   
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
## 2013-07-28 NOTE — ED Notes (Signed)
Patient reports he is not trying to hurt himself, and "I don't remember what I told the lady on the screen".  Patient reports having shoulder pain. Discussed plan of care with Dr. Lavella LemonsManly, she gives verbal order for tylenol for pain management.

## 2013-07-28 NOTE — ED Notes (Signed)
Pt. Ate 100% of his breakfast.

## 2013-07-29 NOTE — ED Notes (Signed)
C/o ;eft shoulder pain-- asks "am I gonna have to come back through the ER to get it checked out?"

## 2013-07-29 NOTE — BH Assessment (Signed)
Spoke with EDP Dr. Steinyl who reports that pt is stable for D/C and will be discharged from ED.  Najah Presley, MS, LCASA  Assessment Counselor       

## 2013-07-29 NOTE — ED Provider Notes (Signed)
Pt awake and alert. States feels much improved today. Denies any thoughts of harm to self/others. Exhibits normal mood/affect. Has normal appetite.  No tremor or shakes. Vitals stable.   Pt w no hallucinations or delusions, no acute psychosis. Discussed outpatient behavioral health resources w pt.  Pt appears stable for d/c.      Suzi RootsKevin E Steinl, MD 07/29/13 [phone]

## 2013-07-29 NOTE — Discharge Instructions (Signed)
Take your medications as directed. Do not use cocaine. Your potassium level is low (3) - eat plenty of fruits and vegetables, take one extra of your potassium supplement each day for the next week, and follow up with your doctor for recheck in 1 week.  For ongoing behavioral health issues and/or crisis, follow up with [facility] in the next couple days.  Also see resource guide provided for additional community health and mental health resources, including substance abuse resources. Follow up with primary care doctor in coming week. Return to ER if worse, new symptoms, fevers, other concern.    Bipolar Disorder Bipolar disorder is a mental illness. The term bipolar disorder actually is used to describe a group of disorders that all share varying degrees of emotional highs and lows that can interfere with daily functioning, such as work, school, or relationships. Bipolar disorder also can lead to drug abuse, hospitalization, and suicide. The emotional highs of bipolar disorder are periods of elation or irritability and high energy. These highs can range from a mild form (hypomania) to a severe form (mania). People experiencing episodes of hypomania may appear energetic, excitable, and highly productive. People experiencing mania may behave impulsively or erratically. They often make poor decisions. They may have difficulty sleeping. The most severe episodes of mania can involve having very distorted beliefs or perceptions about the world and seeing or hearing things that are not real (psychotic delusions and hallucinations).  The emotional lows of bipolar disorder (depression) also can range from mild to severe. Severe episodes of bipolar depression can involve psychotic delusions and hallucinations. Sometimes people with bipolar disorder experience a state of mixed mood. Symptoms of hypomania or mania and depression are both present during this mixed-mood episode. SIGNS AND SYMPTOMS There are signs and  symptoms of the episodes of hypomania and mania as well as the episodes of depression. The signs and symptoms of hypomania and mania are similar but vary in severity. They include:  Inflated self-esteem or feeling of increased self-confidence.  Decreased need for sleep.  Unusual talkativeness (rapid or pressured speech) or the feeling of a need to keep talking.  Sensation of racing thoughts or constant talking, with quick shifts between topics that may or may not be related (flight of ideas).  Decreased ability to focus or concentrate.  Increased purposeful activity, such as work, studies, or social activity, or nonproductive activity, such as pacing, squirming and fidgeting, or finger and toe tapping.  Impulsive behavior and use of poor judgment, resulting in high-risk activities, such as having unprotected sex or spending excessive amounts of money. Signs and symptoms of depression include the following:   Feelings of sadness, hopelessness, or helplessness.  Frequent or uncontrollable episodes of crying.  Lack of feeling anything or caring about anything.  Difficulty sleeping or sleeping too much.  Inability to enjoy the things you used to enjoy.   Desire to be alone all the time.   Feelings of guilt or worthlessness.  Lack of energy or motivation.   Difficulty concentrating, remembering, or making decisions.  Change in appetite or weight beyond normal fluctuations.  Thoughts of death or the desire to harm yourself. DIAGNOSIS  Bipolar disorder is diagnosed through an assessment by your caregiver. Your caregiver will ask questions about your emotional episodes. There are two main types of bipolar disorder. People with type I bipolar disorder have manic episodes with or without depressive episodes. People with type II bipolar disorder have hypomanic episodes and major depressive episodes, which are more  serious than mild depression. The type of bipolar disorder you have  can make an important difference in how your illness is monitored and treated. Your caregiver may ask questions about your medical history and use of alcohol or drugs, including prescription medication. Certain medical conditions and substances also can cause emotional highs and lows that resemble bipolar disorder (secondary bipolar disorder).  TREATMENT  Bipolar disorder is a long-term illness. It is best controlled with continuous treatment rather than treatment only when symptoms occur. The following treatments can be prescribed for bipolar disorders:  Medication--Medication can be prescribed by a doctor that is an expert in treating mental disorders (psychiatrists). Medications called mood stabilizers are usually prescribed to help control the illness. Other medications are sometimes added if symptoms of mania, depression, or psychotic delusions and hallucinations occur despite the use of a mood stabilizer.  Talk therapy--Some forms of talk therapy are helpful in providing support, education, and guidance. A combination of medication and talk therapy is best for managing the disorder over time. A procedure in which electricity is applied to your brain through your scalp (electroconvulsive therapy) is used in cases of severe mania when medication and talk therapy do not work or work too slowly. Document Released: 04/25/2000 Document Revised: 05/14/2012 Document Reviewed: 02/13/2012 [facility] Patient Information 2015 [place], [place]. This information is not intended to replace advice given to you by your health care provider. Make sure you discuss any questions you have with your health care provider.    Cocaine Abuse and Chemical Dependency WHEN IS DRUG USE A PROBLEM? Anytime drug use is interfering with normal living activities it has become abuse. This includes problems with family and friends. Psychological dependence has developed when your mind tells you that the drug is needed. This is  usually followed by physical dependence which has developed when continuing increases of drug are required to get the same feeling or "high". This is known as addiction or chemical dependency. A person's risk is much higher if there is a history of chemical dependency in the family. SIGNS OF CHEMICAL DEPENDENCY:  Been told by friends or family that drugs have become a problem.  Fighting when using drugs.  Having blackouts (not remembering what you do while using).  Feel sick from using drugs but continue using.  Lie about use or amounts of drugs (chemicals) used.  Need chemicals to get you going.  Suffer in work International aid/development worker or school because of drug use.  Get sick from use of drugs but continue to use anyway.  Need drugs to relate to people or feel comfortable in social situations.  Use drugs to forget problems. Yes answered to any of the above signs of chemical dependency indicates there are problems. The longer the use of drugs continues, the greater the problems will become. If there is a family history of drug or alcohol use it is best not to experiment with these drugs. Experimentation leads to tolerance and needing to use more of the drug to get the same feeling. This is followed by addiction where drugs become the most important part of life. It becomes more important to take drugs than participate in the other usual activities of life including relating to friends and family. Addiction is followed by dependency where drugs are now needed not just to get high but to feel normal. Addiction cannot be cured but it can be stopped. This often requires outside help and the care of professionals. Treatment centers are listed in the yellow pages under:  Cocaine, Narcotics, and Alcoholics Anonymous. Most hospitals and clinics can refer you to a specialized care center. WHAT IS COCAINE? Cocaine is a strong nervous system stimulant which speeds up the body and gives the user the feeling that  they have increased energy, loss of appetite and feelings of great pleasure. This "high" which begins within several minutes and lasts for less than an hour is followed by a "crash". The crash and depressed feelings that come with it cause a craving for the drug to regain the high. HOW IS COCANINE USED? Cocaine is snorted, injected, and smoked as free- base or crack. Because smoking the drug produces a greater high it is also associated with a greater low. It is therefore more rapidly addicting. WHAT ARE THE EFFECTS OF COCAINE? It is an anesthetic (pain killer) and a stimulant (it causes a high which gives a false feeling of well being). It increases heart and breathing rates with increases in body temperature and blood pressure. It removes appetite. It causes seizures (convulsions) along with nausea (feeling sick to your stomach), vomiting and stomach pain. This dangerous combination can lead to death. Trying to keep the high feeling leads to greater and greater drug use and this leads to addiction. Addiction can only be helped by stopping use of all chemicals. This is hard but may save your life. If the addiction is continued, the only possible outcome is loss of self respect and self esteem, violence, death, and eventually prison if the addict is fortunate enough to be caught and able to receive help prior to this last life ending event. OTHER HEALTH RISKS OF COCAINE AND ALL DRUG USE ARE:  The increased possibility of getting AIDS or hepatitis (liver inflammation).  Having a baby born which is addicted to cocaine and must go through painful withdrawal including shaking, jerking, and crying in pain. Many of the babies die. Other babies go through life with lifelong disabilities and learning problems. HOW TO STAY DRUG FREE ONCE YOU HAVE QUIT USING:  Develop healthy activities and form friends who do not use drugs.  Stay away from the drug scene.  Tell the pusher or former friend you have other  better things to do.  Have ready excuses available about why you cannot use. For more help or information contact your local physician, clinic, hospital or dial 1-800-cocaine [phone]). Document Released: 01/15/2000 Document Revised: 04/11/2011 Document Reviewed: 09/05/2007 [facility] Patient Information 2015 [place], [place]. This information is not intended to replace advice given to you by your health care provider. Make sure you discuss any questions you have with your health care provider.     Abscess An abscess (boil or furuncle) is an infected area on or under the skin. This area is filled with yellowish-white fluid (pus) and other material (debris). HOME CARE   Only take medicines as told by your doctor.  If you were given antibiotic medicine, take it as directed. Finish the medicine even if you start to feel better.  If gauze is used, follow your doctor's directions for changing the gauze.  To avoid spreading the infection:  Keep your abscess covered with a bandage.  Wash your hands well.  Do not share personal care items, towels, or whirlpools with others.  Avoid skin contact with others.  Keep your skin and clothes clean around the abscess.  Keep all doctor visits as told. GET HELP RIGHT AWAY IF:   You have more pain, puffiness (swelling), or redness in the wound site.  You have  more fluid or blood coming from the wound site.  You have muscle aches, chills, or you feel sick.  You have a fever. MAKE SURE YOU:   Understand these instructions.  Will watch your condition.  Will get help right away if you are not doing well or get worse. Document Released: 07/06/2007 Document Revised: 07/19/2011 Document Reviewed: 04/01/2011 [facility] [place] [facility] Patient Information 2015 [place], [place]. This information is not intended to replace advice given to you by your health care provider. Make sure you discuss any questions you have with your health care  provider.    Hypokalemia Hypokalemia means that the amount of potassium in the blood is lower than normal.Potassium is a chemical, called an electrolyte, that helps regulate the amount of fluid in the body. It also stimulates muscle contraction and helps nerves function properly.Most of the body's potassium is inside of cells, and only a very small amount is in the blood. Because the amount in the blood is so small, minor changes can be life-threatening. CAUSES  Antibiotics.  Diarrhea or vomiting.  Using laxatives too much, which can cause diarrhea.  Chronic kidney disease.  Water pills (diuretics).  Eating disorders (bulimia).  Low magnesium level.  Sweating a lot. SIGNS AND SYMPTOMS  Weakness.  Constipation.  Fatigue.  Muscle cramps.  Mental confusion.  Skipped heartbeats or irregular heartbeat (palpitations).  Tingling or numbness. DIAGNOSIS  Your health care provider can diagnose hypokalemia with blood tests. In addition to checking your potassium level, your health care provider may also check other lab tests. TREATMENT Hypokalemia can be treated with potassium supplements taken by mouth or adjustments in your current medicines. If your potassium level is very low, you may need to get potassium through a vein (IV) and be monitored in the hospital. A diet high in potassium is also helpful. Foods high in potassium are:  Nuts, such as peanuts and pistachios.  Seeds, such as sunflower seeds and pumpkin seeds.  Peas, lentils, and lima beans.  Whole grain and bran cereals and breads.  Fresh fruit and vegetables, such as apricots, avocado, bananas, cantaloupe, kiwi, oranges, tomatoes, asparagus, and potatoes.  Orange and tomato juices.  Red meats.  Fruit yogurt. HOME CARE INSTRUCTIONS  Take all medicines as prescribed by your health care provider.  Maintain a healthy diet by including nutritious food, such as fruits, vegetables, nuts, whole grains, and  lean meats.  If you are taking a laxative, be sure to follow the directions on the label. SEEK MEDICAL CARE IF:  Your weakness gets worse.  You feel your heart pounding or racing.  You are vomiting or having diarrhea.  You are diabetic and having trouble keeping your blood glucose in the normal range. SEEK IMMEDIATE MEDICAL CARE IF:  You have chest pain, shortness of breath, or dizziness.  You are vomiting or having diarrhea for more than 2 days.  You faint. MAKE SURE YOU:   Understand these instructions.  Will watch your condition.  Will get help right away if you are not doing well or get worse. Document Released: 01/17/2005 Document Revised: 11/07/2012 Document Reviewed: 07/20/2012 [facility] Patient Information 2015 [place], [place]. This information is not intended to replace advice given to you by your health care provider. Make sure you discuss any questions you have with your health care provider.     Emergency Department Resource Guide 1) Find a Doctor and Pay Out of Pocket Although you won't have to find out who is covered by your insurance plan, it is a  good idea to ask around and get recommendations. You will then need to call the office and see if the doctor you have chosen will accept you as a new patient and what types of options they offer for patients who are self-pay. Some doctors offer discounts or will set up payment plans for their patients who do not have insurance, but you will need to ask so you aren't surprised when you get to your appointment.  2) Contact Your Local Health Department Not all health departments have doctors that can see patients for sick visits, but many do, so it is worth a call to see if yours does. If you don't know where your local health department is, you can check in your phone book. The CDC also has a tool to help you locate your state's health department, and many state websites also have listings of all of their local health  departments.  3) Find a Walk-in Clinic If your illness is not likely to be very severe or complicated, you may want to try a walk in clinic. These are popping up all over the country in pharmacies, drugstores, and shopping centers. They're usually staffed by nurse practitioners or physician assistants that have been trained to treat common illnesses and complaints. They're usually fairly quick and inexpensive. However, if you have serious medical issues or chronic medical problems, these are probably not your best option.  No Primary Care Doctor: - Call Health Connect at  [phone] - they can help you locate a primary care doctor that  accepts your insurance, provides certain services, etc. - Physician Referral Service- [phone]  Chronic Pain Problems: Organization         Address  Phone   Notes  [facility]  [phone] Patients need to be referred by their primary care doctor.   Medication Assistance: Organization         Address  Phone   Notes  [facility] Medication [facility] [address] [place] [place], [place] [address] [phone] --Must be a resident of [facility] -- Must have NO insurance coverage whatsoever (no Medicaid/ Medicare, etc.) -- The pt. MUST have a primary care doctor that directs their care regularly and follows them in the community   [facility]  [phone]   [employer]  [phone]    Agencies that provide inexpensive medical care: Organization         Address  Phone   Notes  [facility]  [phone]   [facility]    [phone]   [facility] [address] [place], [place] [address] [phone]   [facility] [address] [place] [place] [phone]   Planned Parenthood    [phone]   [facility]    [phone]   [facility]  [address], [place] Phone:  [phone], Fax:  [phone] Hours of Operation:  9 am - 6 pm, M-F.  Also accepts Medicaid/Medicare and self-pay.  [facility]  [address], [place] Phone: [phone], Fax: [phone]. Hours of Operation:  8:30 am - 5:30 pm, M-F.  Also accepts Medicaid and self-pay.  [facility] [place] [address], [place] Phone: [phone]   [facility] [address] [place]asha Bence [place], [place] [phone], Ext. [phone] Mondays & Thursdays: 7-9 AM.  First  15 patients are seen on a first come, first serve basis.    Medicaid-accepting [facility] Providers:  Organization         Address  Phone   Notes  [facility] [address], [place] [phone] Also accepts self-pay patients.  [facility] [address], [place]  [phone]   [facility] [address], [place] [phone]   [facility] [address] [place] [address], [place] [phone]   Renaye RakersVeita Bland [address], [place]   [phone] Only accepts [place] Access [place] patients after they have their name applied to their card.   Self-Pay (no insurance) in [facility]:  Organization         Address  Phone   Notes  Sickle Cell Patients, [facility] [address], [place] [phone]   [facility] [address], [place] [phone]) [phone]   [facility]  [address], [place] [phone]   [facility]/Dr. Osei-Bonsu  [address] [place] [address], [place] or [phone], [place] [phone] Phone number for both [place] and [place] locations is the same.  [facility] [address], [place] [phone]   [facility] [place] [address] [place] [address], [place] or [address] [phone]  [phone]   [facility] [address], [place] [phone]) [phone], phone; [phone], fax Sees patients 1st and 3rd Saturday of every month.  Must not qualify for public or private insurance (i.e. Medicaid, Medicare, [place] Health Choice, Veterans' Benefits)  Household income should be no more than 200% of the poverty level The clinic cannot treat you if you are pregnant or think you are pregnant  Sexually transmitted diseases are not treated at the clinic.    Dental Care: Organization         Address  Phone  Notes  [facility] [place] Department of [facility] [address], [place] [phone] Accepts children up to age 38 who are enrolled in [place] or [place] Health Choice; pregnant women with a Medicaid card; and children who have applied for Medicaid or [place] Health Choice, but were declined, whose parents can pay a reduced fee at time of service.  [facility] of [facility] [place]  [address], [place] [phone] Accepts children up to age 521 who are enrolled in [place] or [place] Health Choice; pregnant women with a Medicaid card; and children who have applied for Medicaid or [place] Health Choice, but were declined, whose parents can pay a reduced fee at time of service.  [facility]  [address], [place] [phone] Patients are seen by appointment only. Walk-ins are not accepted. [facility] will see patients 38 years of age and older. Monday - Tuesday (8am-5pm) Most Wednesdays (8:30-5pm) $30 per visit, cash only  [facility]  [address], [facility] [place] [phone] Patients are seen by appointment only. Walk-ins are not accepted. [facility] will see patients 38 years of age and older. One Wednesday Evening (Monthly: Volunteer Based).  $30 per visit, cash only  [facility]  [phone] for adults;  Children under age 594, call Graduate Pediatric  Dentistry at [phone]. Children aged 58-14, please call [phone] to request a pediatric application.  Dental services are provided in all areas of dental care including fillings, crowns and bridges, complete and partial dentures, implants, gum treatment, root canals, and extractions. Preventive care is also provided. Treatment is provided to both adults and children. Patients are selected via a lottery and there is often a waiting list.   [facility] [address] [place]  [phone] [email or web address]   [facility] [address] [place], [place] [phone], Ext. [phone] Second and Fourth Thursday of each month, opens at 6:30 AM; Clinic ends at 9 AM.  Patients are seen on a first-come first-served basis, and a limited number are seen during each clinic.   [facility]  [address] [place], [place] [phone]   Eligibility Requirements You must have lived in [place], [place], or [place] counties for at least the last three months.   You cannot be eligible for state or federal sponsored [place], including [employer], [place], or [employer].   You generally cannot be eligible for healthcare insurance through your employer.    How to apply: Eligibility screenings are held every Tuesday and Wednesday afternoon from 1:00 pm until 4:00 pm. You do not need an appointment for the interview!  [facility] [address] [place], [place] [phone]   [facility] Health Department  [phone]   [facility] Health Department  [phone]   [facility] Health Department  [phone]    Behavioral Health Resources in the Community: Intensive Outpatient Programs Organization         Address  Phone  Notes  [facility] [address] [place], [place] [phone]   [facility] Outpatient [address], [place], [place] [phone]   [facility] [address] [place], [place]  [phone]   [facility] [address]  [place], [place] [phone] or [phone]   Substance Abuse Resources Organization         Address  Phone  Notes  [facility]  [phone]   [facility]  [phone]   [facility]  [phone]   Floydene Flock  [phone]   Residential & Outpatient Substance Abuse Program  [phone]   Psychological Services Organization         Address  Phone  Notes  [facility]  [phone]   [facility]  [phone]   [facility] [address] [place] [phone] or [phone]    Mobile Crisis Teams Organization         Address  Phone  Notes  [facility], Mobile Crisis Care Unit  [phone]   [facility]  [address] [place], [place] [phone]   Doristine Locks [address] [place] [phone]    Self-Help/Support Groups Organization         Address  Phone             Notes  [facility] - variety of support groups  [phone] Call for more information  Narcotics Anonymous (NA), [facility] [address], [employer] [place]  2 meetings at this location   Chief Executive Officer  Notes  [facility] [address],    [place]  [place]  [phone]   [facility]  [address], [place] [address], [place], [place] [phone]   [facility] [address] [place], [place] [phone] Admissions: 8am-3pm M-F  [facility] [address],    [place], [place] [phone]   [facility] [address] [place], [place], [place] [phone]   [facility] [address],  [place], [place] [phone]   [facility] - Intensive  Outpatient [address], [place], [place] [phone]   [facility] ([facility] [address] [place]  [place], [place] [phone] or [phone]   [facility] ([facility]) [address], [place], [place] [phone] Accepts Medicaid  [facility] [address],  [place] [phone] Substance Abuse/Addiction Treatment   [facility] Organization         Address  Phone  Notes  [facility]  [phone]   Angie Fava, PhD [address] [place], [place]   [phone] or [phone]   [facility]   [address] [place], [place] [phone]   [facility] [address], [place], [place] [phone] Insurance/Medicaid/sponsorship through [facility] and Families [address]                                    [place], [place] [phone] Therapy/tele-psych/case  [facility] [address], [place] [phone]    Dr. Lolly Mustache  [phone]   [facility]  [employer] [facility] Dept. 1) [address], [place] 2) [address] [place] 3)  [address], [place] [phone]  [phone]   [facility] Child Abuse Hotline [phone])613-7875 or [phone] (After Hours)
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
## 2013-08-07 NOTE — Discharge Summary (Signed)
Patient seen, Suicide Assessment Completed.  Disposition Plan Reviewed  
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
## 2018-05-03 DIAGNOSIS — Z79899 Other long term (current) drug therapy: Secondary | ICD-10-CM | POA: Insufficient documentation

## 2018-05-03 DIAGNOSIS — M545 Low back pain: Secondary | ICD-10-CM | POA: Insufficient documentation

## 2018-05-03 DIAGNOSIS — F17210 Nicotine dependence, cigarettes, uncomplicated: Secondary | ICD-10-CM | POA: Insufficient documentation

## 2018-05-03 DIAGNOSIS — I10 Essential (primary) hypertension: Secondary | ICD-10-CM | POA: Insufficient documentation

## 2018-05-04 ENCOUNTER — Emergency Department (HOSPITAL_COMMUNITY): Payer: Self-pay

## 2018-05-04 ENCOUNTER — Emergency Department (HOSPITAL_COMMUNITY)
Admission: EM | Admit: 2018-05-04 | Discharge: 2018-05-04 | Disposition: A | Discharge status: Home / Self Care | Payer: Self-pay | Attending: Emergency Medicine | Admitting: Emergency Medicine

## 2018-05-04 ENCOUNTER — Other Ambulatory Visit: Payer: Self-pay

## 2018-05-04 ENCOUNTER — Encounter (HOSPITAL_COMMUNITY): Payer: Self-pay | Admitting: Emergency Medicine

## 2018-05-04 DIAGNOSIS — M545 Low back pain, unspecified: Secondary | ICD-10-CM

## 2018-05-04 MED ORDER — LIDOCAINE 5 % EX PTCH: 1.0000 | MEDICATED_PATCH | CUTANEOUS | 0 refills | Status: DC

## 2018-05-04 MED ORDER — LIDOCAINE 5 % EX PTCH
1.0000 | MEDICATED_PATCH | CUTANEOUS | Status: DC
  Administered 2018-05-04: 1 via TRANSDERMAL
  Filled 2018-05-04: qty 1

## 2018-05-04 NOTE — ED Triage Notes (Signed)
Pt reports lower middle back pain x 1 week. Pt reports pain has worsened and has been taking ibuprofen with no relief.
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
## 2018-05-04 NOTE — Discharge Instructions (Signed)
Alternate ice and heat to areas of injury 3-4 times per day to limit inflammation and spasm.  Avoid strenuous activity and heavy lifting.  We recommend consistent use of ibuprofen 600mg  every 6 hours with use of a Lidoderm patch as prescribed. We recommend follow-up with a primary care doctor to ensure resolution of symptoms.  Return to the ED for any new or concerning symptoms.

## 2018-05-30 ENCOUNTER — Ambulatory Visit: Payer: Self-pay | Attending: Family Medicine | Admitting: Family Medicine

## 2018-05-30 ENCOUNTER — Other Ambulatory Visit: Payer: Self-pay

## 2018-05-30 ENCOUNTER — Encounter: Payer: Self-pay | Admitting: Family Medicine

## 2018-05-30 VITALS — BP 137/89 | HR 80 | Temp 99.3°F | Ht 72.0 in | Wt 283.0 lb

## 2018-05-30 DIAGNOSIS — Z8669 Personal history of other diseases of the nervous system and sense organs: Secondary | ICD-10-CM

## 2018-05-30 DIAGNOSIS — G8929 Other chronic pain: Secondary | ICD-10-CM

## 2018-05-30 DIAGNOSIS — R739 Hyperglycemia, unspecified: Secondary | ICD-10-CM

## 2018-05-30 DIAGNOSIS — E785 Hyperlipidemia, unspecified: Secondary | ICD-10-CM

## 2018-05-30 DIAGNOSIS — M5442 Lumbago with sciatica, left side: Secondary | ICD-10-CM

## 2018-05-30 DIAGNOSIS — Z79899 Other long term (current) drug therapy: Secondary | ICD-10-CM

## 2018-05-30 DIAGNOSIS — I10 Essential (primary) hypertension: Secondary | ICD-10-CM

## 2018-05-30 MED ORDER — SPIRONOLACTONE 25 MG PO TABS: 25.0000 mg | ORAL_TABLET | Freq: Every day | ORAL | 0 refills | Status: DC

## 2018-05-30 MED ORDER — LISINOPRIL 20 MG PO TABS: 20.0000 mg | ORAL_TABLET | Freq: Every day | ORAL | 1 refills | Status: DC

## 2018-05-30 MED ORDER — CARVEDILOL 25 MG PO TABS: 25.0000 mg | ORAL_TABLET | Freq: Two times a day (BID) | ORAL | 1 refills | Status: DC

## 2018-05-30 MED ORDER — BUSPIRONE HCL 10 MG PO TABS: 10.0000 mg | ORAL_TABLET | Freq: Two times a day (BID) | ORAL | 1 refills | Status: DC

## 2018-05-30 MED ORDER — AMLODIPINE BESYLATE 5 MG PO TABS: 5.0000 mg | ORAL_TABLET | Freq: Every day | ORAL | 1 refills | Status: DC

## 2018-05-30 MED ORDER — ATORVASTATIN CALCIUM 80 MG PO TABS: 80.0000 mg | ORAL_TABLET | Freq: Every day | ORAL | 1 refills | Status: DC

## 2018-05-30 MED ORDER — FUROSEMIDE 40 MG PO TABS: 40.0000 mg | ORAL_TABLET | Freq: Every day | ORAL | 0 refills | Status: DC
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
## 2018-05-31 LAB — COMPREHENSIVE METABOLIC PANEL WITH GFR
ALT: 21 IU/L (ref 0–44)
AST: 24 IU/L (ref 0–40)
Albumin/Globulin Ratio: 1.5 (ref 1.2–2.2)
Albumin: 4.5 g/dL (ref 4.0–5.0)
Alkaline Phosphatase: 61 IU/L (ref 39–117)
BUN/Creatinine Ratio: 11 (ref 9–20)
BUN: 16 mg/dL (ref 6–24)
Bilirubin Total: 0.3 mg/dL (ref 0.0–1.2)
CO2: 24 mmol/L (ref 20–29)
Calcium: 10.1 mg/dL (ref 8.7–10.2)
Chloride: 101 mmol/L (ref 96–106)
Creatinine, Ser: 1.43 mg/dL — ABNORMAL HIGH (ref 0.76–1.27)
GFR calc Af Amer: 69 mL/min/1.73
GFR calc non Af Amer: 60 mL/min/1.73
Globulin, Total: 3 g/dL (ref 1.5–4.5)
Glucose: 83 mg/dL (ref 65–99)
Potassium: 4 mmol/L (ref 3.5–5.2)
Sodium: 145 mmol/L — ABNORMAL HIGH (ref 134–144)
Total Protein: 7.5 g/dL (ref 6.0–8.5)

## 2018-05-31 LAB — BRAIN NATRIURETIC PEPTIDE: BNP: 84.6 pg/mL (ref 0.0–100.0)

## 2018-05-31 LAB — HEMOGLOBIN A1C
Est. average glucose Bld gHb Est-mCnc: 97 mg/dL
Hgb A1c MFr Bld: 5 % (ref 4.8–5.6)

## 2018-06-05 ENCOUNTER — Encounter: Payer: Self-pay | Admitting: *Deleted

## 2018-07-11 ENCOUNTER — Ambulatory Visit: Payer: Self-pay | Admitting: Family Medicine

## 2019-06-03 ENCOUNTER — Other Ambulatory Visit: Payer: Self-pay | Admitting: Family Medicine

## 2019-06-03 DIAGNOSIS — I10 Essential (primary) hypertension: Secondary | ICD-10-CM

## 2019-06-06 ENCOUNTER — Other Ambulatory Visit: Payer: Self-pay | Admitting: Family Medicine

## 2019-06-06 DIAGNOSIS — I10 Essential (primary) hypertension: Secondary | ICD-10-CM

## 2019-08-16 ENCOUNTER — Inpatient Hospital Stay (HOSPITAL_COMMUNITY)
Admission: EM | Admit: 2019-08-16 | Discharge: 2019-08-18 | DRG: 917 | Discharge status: Against Medical Advice | Payer: Self-pay | Attending: Pulmonary Disease | Admitting: Pulmonary Disease

## 2019-08-16 ENCOUNTER — Emergency Department (HOSPITAL_COMMUNITY): Payer: Self-pay

## 2019-08-16 ENCOUNTER — Inpatient Hospital Stay (HOSPITAL_COMMUNITY): Payer: Self-pay

## 2019-08-16 ENCOUNTER — Encounter (HOSPITAL_COMMUNITY): Payer: Self-pay | Admitting: Emergency Medicine

## 2019-08-16 ENCOUNTER — Other Ambulatory Visit: Payer: Self-pay

## 2019-08-16 DIAGNOSIS — T50904A Poisoning by unspecified drugs, medicaments and biological substances, undetermined, initial encounter: Secondary | ICD-10-CM

## 2019-08-16 DIAGNOSIS — G8929 Other chronic pain: Secondary | ICD-10-CM | POA: Diagnosis present

## 2019-08-16 DIAGNOSIS — G4733 Obstructive sleep apnea (adult) (pediatric): Secondary | ICD-10-CM | POA: Diagnosis present

## 2019-08-16 DIAGNOSIS — Z5329 Procedure and treatment not carried out because of patient's decision for other reasons: Secondary | ICD-10-CM | POA: Diagnosis present

## 2019-08-16 DIAGNOSIS — Z20822 Contact with and (suspected) exposure to covid-19: Secondary | ICD-10-CM | POA: Diagnosis present

## 2019-08-16 DIAGNOSIS — Z91120 Patient's intentional underdosing of medication regimen due to financial hardship: Secondary | ICD-10-CM

## 2019-08-16 DIAGNOSIS — I509 Heart failure, unspecified: Secondary | ICD-10-CM | POA: Diagnosis present

## 2019-08-16 DIAGNOSIS — E669 Obesity, unspecified: Secondary | ICD-10-CM | POA: Diagnosis present

## 2019-08-16 DIAGNOSIS — E876 Hypokalemia: Secondary | ICD-10-CM | POA: Diagnosis present

## 2019-08-16 DIAGNOSIS — R400 Somnolence: Secondary | ICD-10-CM

## 2019-08-16 DIAGNOSIS — G92 Toxic encephalopathy: Secondary | ICD-10-CM | POA: Diagnosis present

## 2019-08-16 DIAGNOSIS — F319 Bipolar disorder, unspecified: Secondary | ICD-10-CM | POA: Diagnosis present

## 2019-08-16 DIAGNOSIS — M549 Dorsalgia, unspecified: Secondary | ICD-10-CM | POA: Diagnosis present

## 2019-08-16 DIAGNOSIS — Z590 Homelessness: Secondary | ICD-10-CM

## 2019-08-16 DIAGNOSIS — Z886 Allergy status to analgesic agent status: Secondary | ICD-10-CM

## 2019-08-16 DIAGNOSIS — Z9119 Patient's noncompliance with other medical treatment and regimen: Secondary | ICD-10-CM

## 2019-08-16 DIAGNOSIS — R0602 Shortness of breath: Secondary | ICD-10-CM

## 2019-08-16 DIAGNOSIS — T50901A Poisoning by unspecified drugs, medicaments and biological substances, accidental (unintentional), initial encounter: Secondary | ICD-10-CM | POA: Diagnosis present

## 2019-08-16 DIAGNOSIS — Z6839 Body mass index (BMI) 39.0-39.9, adult: Secondary | ICD-10-CM

## 2019-08-16 DIAGNOSIS — T401X1A Poisoning by heroin, accidental (unintentional), initial encounter: Principal | ICD-10-CM | POA: Diagnosis present

## 2019-08-16 DIAGNOSIS — F209 Schizophrenia, unspecified: Secondary | ICD-10-CM | POA: Diagnosis present

## 2019-08-16 DIAGNOSIS — I110 Hypertensive heart disease with heart failure: Secondary | ICD-10-CM | POA: Diagnosis present

## 2019-08-16 DIAGNOSIS — Z885 Allergy status to narcotic agent status: Secondary | ICD-10-CM

## 2019-08-16 LAB — URINALYSIS, ROUTINE W REFLEX MICROSCOPIC
Bilirubin Urine: NEGATIVE
Glucose, UA: NEGATIVE mg/dL
Hgb urine dipstick: NEGATIVE
Ketones, ur: NEGATIVE mg/dL
Leukocytes,Ua: NEGATIVE
Nitrite: NEGATIVE
Protein, ur: NEGATIVE mg/dL
Specific Gravity, Urine: 1.012 (ref 1.005–1.030)
pH: 8 (ref 5.0–8.0)

## 2019-08-16 LAB — BASIC METABOLIC PANEL
Anion gap: 8 (ref 5–15)
BUN: 10 mg/dL (ref 6–20)
CO2: 30 mmol/L (ref 22–32)
Calcium: 8.7 mg/dL — ABNORMAL LOW (ref 8.9–10.3)
Chloride: 101 mmol/L (ref 98–111)
Creatinine, Ser: 1.06 mg/dL (ref 0.61–1.24)
GFR calc Af Amer: >60 mL/min (ref >60)
GFR calc non Af Amer: >60 mL/min (ref >60)
Glucose, Bld: 87 mg/dL (ref 70–99)
Potassium: 3.2 mmol/L — ABNORMAL LOW (ref 3.5–5.1)
Sodium: 139 mmol/L (ref 135–145)

## 2019-08-16 LAB — I-STAT ARTERIAL BLOOD GAS, ED
Acid-Base Excess: 5 mmol/L — ABNORMAL HIGH (ref 0.0–2.0)
Bicarbonate: 32 mmol/L — ABNORMAL HIGH (ref 20.0–28.0)
Calcium, Ion: 1.23 mmol/L (ref 1.15–1.40)
HCT: 40 % (ref 39.0–52.0)
Hemoglobin: 13.6 g/dL (ref 13.0–17.0)
O2 Saturation: 91 %
Patient temperature: 98.6
Potassium: 3 mmol/L — ABNORMAL LOW (ref 3.5–5.1)
Sodium: 142 mmol/L (ref 135–145)
TCO2: 34 mmol/L — ABNORMAL HIGH (ref 22–32)
pCO2 arterial: 56.6 mmHg — ABNORMAL HIGH (ref 32.0–48.0)
pH, Arterial: 7.361 (ref 7.350–7.450)
pO2, Arterial: 66 mmHg — ABNORMAL LOW (ref 83.0–108.0)

## 2019-08-16 LAB — HEPATIC FUNCTION PANEL
ALT: 22 U/L (ref 0–44)
AST: 29 U/L (ref 15–41)
Albumin: 4.1 g/dL (ref 3.5–5.0)
Alkaline Phosphatase: 70 U/L (ref 38–126)
Bilirubin, Direct: <0.1 mg/dL (ref 0.0–0.2)
Total Bilirubin: 0.7 mg/dL (ref 0.3–1.2)
Total Protein: 8 g/dL (ref 6.5–8.1)

## 2019-08-16 LAB — VALPROIC ACID LEVEL: Valproic Acid Lvl: <10 ug/mL — ABNORMAL LOW (ref 50.0–100.0)

## 2019-08-16 LAB — TROPONIN I (HIGH SENSITIVITY)
Troponin I (High Sensitivity): 60 ng/L — ABNORMAL HIGH (ref <18)
Troponin I (High Sensitivity): 62 ng/L — ABNORMAL HIGH (ref <18)
Troponin I (High Sensitivity): 70 ng/L — ABNORMAL HIGH (ref <18)

## 2019-08-16 LAB — SARS CORONAVIRUS 2 BY RT PCR (HOSPITAL ORDER, PERFORMED IN ~~LOC~~ HOSPITAL LAB): SARS Coronavirus 2: NEGATIVE

## 2019-08-16 LAB — CBC
HCT: 40.7 % (ref 39.0–52.0)
Hemoglobin: 13.2 g/dL (ref 13.0–17.0)
MCH: 29.6 pg (ref 26.0–34.0)
MCHC: 32.4 g/dL (ref 30.0–36.0)
MCV: 91.3 fL (ref 80.0–100.0)
Platelets: 300 10*3/uL (ref 150–400)
RBC: 4.46 MIL/uL (ref 4.22–5.81)
RDW: 12.4 % (ref 11.5–15.5)
WBC: 7.2 10*3/uL (ref 4.0–10.5)
nRBC: 0 % (ref 0.0–0.2)

## 2019-08-16 LAB — AMMONIA: Ammonia: 63 umol/L — ABNORMAL HIGH (ref 9–35)

## 2019-08-16 LAB — RAPID URINE DRUG SCREEN, HOSP PERFORMED
Amphetamines: POSITIVE — AB
Barbiturates: NOT DETECTED
Benzodiazepines: NOT DETECTED
Cocaine: NOT DETECTED
Opiates: NOT DETECTED
Tetrahydrocannabinol: NOT DETECTED

## 2019-08-16 LAB — GLUCOSE, CAPILLARY: Glucose-Capillary: 83 mg/dL (ref 70–99)

## 2019-08-16 LAB — BRAIN NATRIURETIC PEPTIDE: B Natriuretic Peptide: 114.8 pg/mL — ABNORMAL HIGH (ref 0.0–100.0)

## 2019-08-16 LAB — HIV ANTIBODY (ROUTINE TESTING W REFLEX): HIV Screen 4th Generation wRfx: NONREACTIVE

## 2019-08-16 MED ORDER — ONDANSETRON HCL 4 MG/2ML IJ SOLN
4.0000 mg | Freq: Once | INTRAMUSCULAR | Status: AC
  Administered 2019-08-16: 4 mg via INTRAVENOUS
  Filled 2019-08-16: qty 2

## 2019-08-16 MED ORDER — NALOXONE HCL 0.4 MG/ML IJ SOLN
0.4000 mg | Freq: Once | INTRAMUSCULAR | Status: AC
  Administered 2019-08-16: 0.4 mg via INTRAVENOUS

## 2019-08-16 MED ORDER — ENOXAPARIN SODIUM 60 MG/0.6ML ~~LOC~~ SOLN
60.0000 mg | SUBCUTANEOUS | Status: DC
  Administered 2019-08-16 – 2019-08-17 (×2): 60 mg via SUBCUTANEOUS
  Filled 2019-08-16 (×3): qty 0.6

## 2019-08-16 MED ORDER — POLYETHYLENE GLYCOL 3350 17 G PO PACK: 17.0000 g | PACK | Freq: Every day | ORAL | Status: DC | PRN

## 2019-08-16 MED ORDER — NALOXONE HCL 2 MG/2ML IJ SOSY
PREFILLED_SYRINGE | INTRAMUSCULAR | Status: AC
  Filled 2019-08-16: qty 2

## 2019-08-16 MED ORDER — AMLODIPINE BESYLATE 5 MG PO TABS: 5.0000 mg | ORAL_TABLET | Freq: Once | ORAL | Status: DC

## 2019-08-16 MED ORDER — POTASSIUM CHLORIDE 10 MEQ/100ML IV SOLN
10.0000 meq | INTRAVENOUS | Status: AC
  Administered 2019-08-16 – 2019-08-17 (×6): 10 meq via INTRAVENOUS
  Filled 2019-08-16 (×6): qty 100

## 2019-08-16 MED ORDER — NALOXONE HCL 0.4 MG/ML IJ SOLN
0.4000 mg | Freq: Once | INTRAMUSCULAR | Status: AC
  Administered 2019-08-16: 0.4 mg via INTRAVENOUS
  Filled 2019-08-16: qty 1

## 2019-08-16 MED ORDER — DOCUSATE SODIUM 100 MG PO CAPS: 100.0000 mg | ORAL_CAPSULE | Freq: Two times a day (BID) | ORAL | Status: DC | PRN

## 2019-08-16 MED ORDER — HYDRALAZINE HCL 25 MG PO TABS
25.0000 mg | ORAL_TABLET | Freq: Three times a day (TID) | ORAL | Status: DC
  Administered 2019-08-16 – 2019-08-18 (×5): 25 mg via ORAL
  Filled 2019-08-16 (×5): qty 1

## 2019-08-16 MED ORDER — AMLODIPINE BESYLATE 5 MG PO TABS: 5.0000 mg | ORAL_TABLET | Freq: Every day | ORAL | Status: DC

## 2019-08-16 MED ORDER — CARVEDILOL 25 MG PO TABS: 25.0000 mg | ORAL_TABLET | Freq: Once | ORAL | Status: DC

## 2019-08-16 MED ORDER — CHLORHEXIDINE GLUCONATE CLOTH 2 % EX PADS
6.0000 | MEDICATED_PAD | Freq: Every day | CUTANEOUS | Status: DC
  Administered 2019-08-16 – 2019-08-17 (×2): 6 via TOPICAL

## 2019-08-16 MED ORDER — HYDRALAZINE HCL 20 MG/ML IJ SOLN
20.0000 mg | INTRAMUSCULAR | Status: DC | PRN
  Administered 2019-08-16 – 2019-08-17 (×2): 20 mg via INTRAVENOUS
  Filled 2019-08-16: qty 1

## 2019-08-16 MED ORDER — CARVEDILOL 25 MG PO TABS
25.0000 mg | ORAL_TABLET | Freq: Two times a day (BID) | ORAL | Status: DC
  Administered 2019-08-17 – 2019-08-18 (×3): 25 mg via ORAL
  Filled 2019-08-16 (×3): qty 1

## 2019-08-16 MED ORDER — NALOXONE HCL 4 MG/10ML IJ SOLN
0.2500 mg/h | INTRAVENOUS | Status: DC
  Administered 2019-08-16 – 2019-08-18 (×3): 0.25 mg/h via INTRAVENOUS
  Filled 2019-08-16 (×2): qty 10

## 2019-08-16 MED ORDER — HYDROCHLOROTHIAZIDE 25 MG PO TABS: 25.0000 mg | ORAL_TABLET | Freq: Once | ORAL | Status: DC

## 2019-08-16 NOTE — ED Triage Notes (Signed)
Patient arrives to ED POV with complaints of increased leg swelling and SOB over the last few days. Patient states today it is hard to walk and pain increased. Patient states hx of CHF and homelessness, has not been able to take his medications. Reports drug use, last snorted pain pills this morning.
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
## 2019-08-16 NOTE — ED Notes (Signed)
At this time, patient had significant left sided facial drooping after asking the pt to smile for this tech. Before this new symptom occurred, patient did not present this facial dropping. This patient was previously able to ambulate with a steady gait and able to move both arms and legs. Clark the RN was notified.

## 2019-08-16 NOTE — ED Provider Notes (Signed)
[facility] EMERGENCY DEPARTMENT Provider Note   CSN: [id number] Arrival date & time: 08/16/19  1218     History Chief Complaint  Patient presents with  . Leg Swelling  . Shortness of Breath   LEVEL 5 CAVEAT - ALTERED LEVEL OF CONSCIOUSNESS S/2 HEROINE USE  Ryan Horn is a 44 y.o. male with PMHx HTN, depression, bipolar disorder, schizophrenia, chronic back pain who presents to the ED today with complaint of shortness of breath over the last few days. Per triage report pt also complaining of increased leg swelling bilaterally. Pt had mentioned during triage that he has not been able to take his medications as he is currently homeless. Pt had also mentioned that he last snorted "pain pills" this morning however when pt got back to the room he reported snorting heroine earlier today. He is unable to provide much more history given drowsiness s/2 drug use. Pt does mention "a little" bit of chest pain as well.   The history is provided by the patient and medical records.       Past Medical History:  Diagnosis Date  . Bipolar 1 disorder (HCC)   . Chronic back pain   . Depression   . Hypertension   . Obesity   . Schizophrenia (HCC)   . Shortness of breath   . Sleep apnea     Patient Active Problem List   Diagnosis Date Noted  . Drug overdose 08/16/2019  . Essential hypertension, benign 07/16/2013  . MDD (major depressive disorder) 07/14/2013  . Depression 07/13/2013  . Suicidal ideations 07/13/2013  . Altered mental status 05/24/2011  . Cocaine abuse (HCC) 05/24/2011  . Hypokalemia 05/24/2011  . Polysubstance abuse (HCC) 05/24/2011  . Apnea 05/24/2011  . Rhabdomyolysis 05/24/2011    Past Surgical History:  Procedure Laterality Date  . CHOLECYSTECTOMY         Family History  Problem Relation Age of Onset  . Anesthesia problems Mother     Social History   Tobacco Use  . Smoking status: Current Every Day Smoker    Packs/day: 0.25     Years: 22.00    Pack years: 5.50    Types: Cigarettes  . Smokeless tobacco: Never Used  Substance Use Topics  . Alcohol use: Yes    Comment: social  . Drug use: Yes    Types: "Crack" cocaine, Marijuana    Home Medications Prior to Admission medications   Medication Sig Start Date End Date Taking? Authorizing Provider  amLODipine (NORVASC) 5 MG tablet Take 1 tablet (5 mg total) by mouth daily. To lower blood pressure 05/30/18   Fulp, Cammie, MD  ARIPiprazole (ABILIFY) 10 MG tablet Take 1 tablet (10 mg total) by mouth daily. 07/18/13   Withrow, Everardo All, FNP  atorvastatin (LIPITOR) 80 MG tablet Take 1 tablet (80 mg total) by mouth daily. To lower cholesterol 05/30/18   Fulp, Cammie, MD  busPIRone (BUSPAR) 10 MG tablet Take 1 tablet (10 mg total) by mouth 2 (two) times daily. 05/30/18   Fulp, Cammie, MD  carvedilol (COREG) 25 MG tablet Take 1 tablet (25 mg total) by mouth 2 (two) times daily with a meal. To lower blood pressure 05/30/18   Fulp, Cammie, MD  cloNIDine (CATAPRES - DOSED IN MG/24 HR) 0.1 mg/24hr patch Place 1 patch (0.1 mg total) onto the skin once a week. 07/18/13   Withrow, Everardo All, FNP  divalproex (DEPAKOTE) 500 MG DR tablet Take 1 tablet (500 mg total) by mouth  2 (two) times daily. 07/18/13   Withrow, Everardo All, FNP  furosemide (LASIX) 40 MG tablet Take 1 tablet (40 mg total) by mouth daily. 05/30/18   Fulp, Cammie, MD  hydrALAZINE (APRESOLINE) 25 MG tablet Take 1 tablet (25 mg total) by mouth every 8 (eight) hours. 07/18/13   Withrow, Everardo All, FNP  hydrochlorothiazide (HYDRODIURIL) 25 MG tablet Take 1 tablet (25 mg total) by mouth daily. 07/18/13   Withrow, Everardo All, FNP  lidocaine (LIDODERM) 5 % Place 1 patch onto the skin daily. Apply to back as instructed for pain. Remove & discard patch within 12 hours. 05/04/18   Antony Madura, PA-C  lisinopril (ZESTRIL) 20 MG tablet Take 1 tablet (20 mg total) by mouth daily. To lower blood pressure 05/30/18   Fulp, Cammie, MD  oxyCODONE (OXY IR/ROXICODONE) 5  MG immediate release tablet Take 1 tablet (5 mg total) by mouth every 4 (four) hours as needed for severe pain. 07/12/13   Dione Booze, MD  sertraline (ZOLOFT) 50 MG tablet Take 1 tablet (50 mg total) by mouth at bedtime. 07/18/13   Withrow, Everardo All, FNP  silver sulfADIAZINE (SILVADENE) 1 % cream Apply 1 application topically daily. For burned areas 07/18/13   Withrow, Everardo All, FNP  spironolactone (ALDACTONE) 25 MG tablet Take 1 tablet (25 mg total) by mouth daily. 05/30/18   Fulp, Cammie, MD    Allergies    Hydrocodone, Tramadol, and Tylenol [acetaminophen]  Review of Systems   Review of Systems  Unable to perform ROS: Other  Respiratory: Positive for shortness of breath.   Cardiovascular: Positive for chest pain and leg swelling.    Physical Exam Updated Vital Signs BP (!) 183/96 (BP Location: Right Arm)   Pulse 78   Temp 98.8 F (37.1 C) (Oral)   Resp 14   SpO2 99%   Physical Exam Vitals and nursing note reviewed.  Constitutional:      Appearance: He is obese. He is not ill-appearing.     Comments: Drowsy on exam however easily arousable. Pt will drift back off to sleep within seconds of waking him up  HENT:     Head: Normocephalic and atraumatic.  Eyes:     Conjunctiva/sclera: Conjunctivae normal.  Cardiovascular:     Rate and Rhythm: Normal rate and regular rhythm.     Pulses: Normal pulses.  Pulmonary:     Effort: Pulmonary effort is normal.     Breath sounds: Rhonchi present.  Chest:     Chest wall: No tenderness.  Abdominal:     Palpations: Abdomen is soft.     Tenderness: There is no abdominal tenderness. There is no guarding or rebound.  Musculoskeletal:     Cervical back: Neck supple.     Right lower leg: Edema present.     Left lower leg: Edema present.     Comments: 1+ pitting edema bilaterally  Skin:    General: Skin is warm and dry.  Neurological:     Mental Status: He is alert.     ED Results / Procedures / Treatments   Labs (all labs ordered are  listed, but only abnormal results are displayed) Labs Reviewed  BASIC METABOLIC PANEL - Abnormal; Notable for the following components:      Result Value   Potassium 3.2 (*)    Calcium 8.7 (*)    All other components within normal limits  BRAIN NATRIURETIC PEPTIDE - Abnormal; Notable for the following components:   B Natriuretic Peptide 114.8 (*)  All other components within normal limits  URINALYSIS, ROUTINE W REFLEX MICROSCOPIC - Abnormal; Notable for the following components:   APPearance HAZY (*)    All other components within normal limits  RAPID URINE DRUG SCREEN, HOSP PERFORMED - Abnormal; Notable for the following components:   Amphetamines POSITIVE (*)    All other components within normal limits  I-STAT ARTERIAL BLOOD GAS, ED - Abnormal; Notable for the following components:   pCO2 arterial 56.6 (*)    pO2, Arterial 66 (*)    Bicarbonate 32.0 (*)    TCO2 34 (*)    Acid-Base Excess 5.0 (*)    Potassium 3.0 (*)    All other components within normal limits  TROPONIN I (HIGH SENSITIVITY) - Abnormal; Notable for the following components:   Troponin I (High Sensitivity) 60 (*)    All other components within normal limits  TROPONIN I (HIGH SENSITIVITY) - Abnormal; Notable for the following components:   Troponin I (High Sensitivity) 62 (*)    All other components within normal limits  SARS CORONAVIRUS 2 BY RT PCR (HOSPITAL ORDER, PERFORMED IN  HOSPITAL LAB)  CBC  BLOOD GAS, ARTERIAL  DRUG SCREEN 10 W/CONF, SERUM  HIV ANTIBODY (ROUTINE TESTING W REFLEX)  CBC  BASIC METABOLIC PANEL  VALPROIC ACID LEVEL  TROPONIN I (HIGH SENSITIVITY)    EKG EKG Interpretation  Date/Time:  Friday August 16 2019 12:39:44 EDT Ventricular Rate:  70 PR Interval:  158 QRS Duration: 98 QT Interval:  446 QTC Calculation: 481 R Axis:   63 Text Interpretation: Normal sinus rhythm Prolonged QT Abnormal ECG Since last tracing ST and T wave abn not as prominent Confirmed by Eber Hong ([id number]) on 08/16/2019 2:34:02 PM   Radiology DG Chest Port 1 View  Result Date: 08/16/2019 CLINICAL DATA:  Shortness of breath EXAM: PORTABLE CHEST 1 VIEW COMPARISON:  May 26, 2012 FINDINGS: There is mild cardiomegaly. Prominence of the central pulmonary vasculature is seen. No large airspace consolidation or effusion. The visualized skeletal structures are unremarkable. IMPRESSION: Mild cardiomegaly and pulmonary vascular congestion. Electronically Signed   By: Jonna Clark M.D.   On: 08/16/2019 16:01    Procedures Procedures (including critical care time)  Medications Ordered in ED Medications  amLODipine (NORVASC) tablet 5 mg (has no administration in time range)  carvedilol (COREG) tablet 25 mg (has no administration in time range)  hydrochlorothiazide (HYDRODIURIL) tablet 25 mg (has no administration in time range)  naloxone (NARCAN) 2 MG/2ML injection (has no administration in time range)  naloxone HCl (NARCAN) 4 mg in dextrose 5 % 250 mL infusion (0.25 mg/hr Intravenous New Bag/Given 08/16/19 1714)  docusate sodium (COLACE) capsule 100 mg (has no administration in time range)  polyethylene glycol (MIRALAX / GLYCOLAX) packet 17 g (has no administration in time range)  enoxaparin (LOVENOX) injection 40 mg (has no administration in time range)  naloxone [facility]) injection 0.4 mg (0.4 mg Intravenous Given 08/16/19 1524)  ondansetron (ZOFRAN) injection 4 mg (4 mg Intravenous Given 08/16/19 1524)  naloxone (NARCAN) injection 0.4 mg (0.4 mg Intravenous Given 08/16/19 1649)    ED Course  I have reviewed the triage vital signs and the nursing notes.  Pertinent labs & imaging results that were available during my care of the patient were reviewed by me and considered in my medical decision making (see chart for details).    MDM Rules/Calculators/A&P  44 year old male who is currently homeless presenting to the ED with complaints of shortness of breath and  bilateral leg swelling for the past couple of days, has been out of his medications for approximately 1 month due to homelessness.  Per chart review it appears patient is on 40 mg Lasix daily.  Unable to see history of CHF per chart review or an echocardiogram.  On arrival to the ED patient is afebrile, nontachycardic and nontachypneic.  Pressure noted to be elevated 190/117, I suspect this is secondary to patient being out of his medications.  On my exam patient is very drowsy and will drift off to sleep immediately however he is easily arousable.  He states that he snorted heroin earlier today.  Will provide Narcan as patient will intermittently desaturate when he falls asleep however satting 99% when awake.  We will continue to monitor.  He has very mild rhonchorous breath sounds and 1+ pitting edema bilaterally.  Obtain chest x-ray.  Patient does state he has had "a little" chest pain however unable to elaborate on this secondary to drowsiness.  Will add on EKG, troponin.  Will add on BNP as well.  Patient's blood pressure medications ordered.   0.4 mg Narcan given IV; pt did have an appropriate response afterwards however approximately 40 minutes later I was pulled back into the room as pt began desatting again. He is arousable to painful stimuli however quickly falls back asleep. Attending physician Dr. Anitra LauthPlunkett evaluated pt at that time as well; pinpoint pupils identified. Another 0.4 mg IV Narcan given without much response and therefor Narcan drip started. ABG was ordered which does show compensating respiratory acidosis likely chronic in nature. Pt has been placed on 2L [place] as well.   CXR with cardiomegaly and pulmonary vascular congestion however BNP not significantly elevated at 114.8.  Troponin of 60, will repeat.  CBC without leukocytosis. Hgb stable at 13.2.  BMP with potassium 3.2. No other electrolyte abnormalities.   Pt reevaluated on narcan drip. He is more easily arousable. I was able to  have a full conversation with him; he states he thinks he may have been given something other than heroine as it was "really strong." Pt has no complaints at this time and is requesting food. He had already been given 1 sandwich which he ate prior to dosing back off to sleep. After our conversation pt went back to sleep; he is maintaining his airway. Will consult for admission at this time.   Discussed case with intensivist Dr. Tonia BroomsIcard who will have critical care team come down to evaluate patient.   This note was prepared using Dragon voice recognition software and may include unintentional dictation errors due to the inherent limitations of voice recognition software.   Final Clinical Impression(s) / ED Diagnoses Final diagnoses:  Accidental drug overdose, initial encounter  Shortness of breath    Rx / DC Orders ED Discharge Orders    None       Tanda RockersVenter, Noe Goyer, PA-C 08/16/19 Harlan Stains2005    Plunkett, Whitney, MD 08/18/19 1644

## 2019-08-16 NOTE — ED Notes (Signed)
Pt continues to be lethargic, only responsive to sternal rubs.

## 2019-08-16 NOTE — ED Notes (Addendum)
MD paged d/t pt's bp 200/105. Pt unable to take PO meds

## 2019-08-16 NOTE — Progress Notes (Addendum)
eLink Physician-Brief Progress Note Patient Name: Ryan Horn DOB: 09-04-1975 MRN: [id number]   Date of Service  08/16/2019  HPI/Events of Note  43/M with HTN, OSA, non-compliant with CPAP, homelessness, presenting due to lower extremity pain and swelling.  He admitted to heroin use and was somnolent in the ED, requiring Narcan gtt.  Pt admitted to the ICU.   On camera assessment, pt is awake and alert.  He is able to carry a conversation and requesting for food.  eICU Interventions  Continue BP meds.   Titrate narcan gtt to off.  Heart health diet ordered. Lovenox for DVT prophylaxis. No indication for GI prophylaxis.     Intervention Category Evaluation Type: New Patient Evaluation  Larinda Buttery 08/16/2019, 9:36 PM   1:04 AM Notified of persistent hypertension with SBP in the 180s.   Plan> Restart lisinopril 20mg  and clonidine 0.1mg  BID.

## 2019-08-16 NOTE — Progress Notes (Signed)
eLink Physician-Brief Progress Note Patient Name: Gorge Almanza DOB: 09-13-1975 MRN: [id number]   Date of Service  08/16/2019  HPI/Events of Note  Notified of hypertension with BP in the 200s.  RN is concerned with regards mental status and has not given PO medication.  eICU Interventions  Give hydralazine IV to lower SBP into the 160s.     Intervention Category Intermediate Interventions: Hypertension - evaluation and management  Larinda Buttery 08/16/2019, 9:07 PM

## 2019-08-16 NOTE — Progress Notes (Signed)
Patient not placed on CPAP tonight due to being lethargic. Patient currently tolerating nasal cannula with normal vital signs.

## 2019-08-16 NOTE — ED Notes (Signed)
Pt verbally expressed " I am not trying to live anymore."

## 2019-08-17 ENCOUNTER — Other Ambulatory Visit: Payer: Self-pay

## 2019-08-17 ENCOUNTER — Inpatient Hospital Stay (HOSPITAL_COMMUNITY): Payer: Self-pay

## 2019-08-17 DIAGNOSIS — R0602 Shortness of breath: Secondary | ICD-10-CM

## 2019-08-17 DIAGNOSIS — I509 Heart failure, unspecified: Secondary | ICD-10-CM

## 2019-08-17 DIAGNOSIS — T50901D Poisoning by unspecified drugs, medicaments and biological substances, accidental (unintentional), subsequent encounter: Secondary | ICD-10-CM

## 2019-08-17 LAB — BASIC METABOLIC PANEL
Anion gap: 11 (ref 5–15)
Anion gap: 11 (ref 5–15)
BUN: 13 mg/dL (ref 6–20)
BUN: 7 mg/dL (ref 6–20)
CO2: 25 mmol/L (ref 22–32)
CO2: 28 mmol/L (ref 22–32)
Calcium: 9 mg/dL (ref 8.9–10.3)
Calcium: 9.1 mg/dL (ref 8.9–10.3)
Chloride: 100 mmol/L (ref 98–111)
Chloride: 103 mmol/L (ref 98–111)
Creatinine, Ser: 1.01 mg/dL (ref 0.61–1.24)
Creatinine, Ser: 1.23 mg/dL (ref 0.61–1.24)
GFR calc Af Amer: >60 mL/min (ref >60)
GFR calc Af Amer: >60 mL/min (ref >60)
GFR calc non Af Amer: >60 mL/min (ref >60)
GFR calc non Af Amer: >60 mL/min (ref >60)
Glucose, Bld: 117 mg/dL — ABNORMAL HIGH (ref 70–99)
Glucose, Bld: 119 mg/dL — ABNORMAL HIGH (ref 70–99)
Potassium: 3.1 mmol/L — ABNORMAL LOW (ref 3.5–5.1)
Potassium: 3.3 mmol/L — ABNORMAL LOW (ref 3.5–5.1)
Sodium: 136 mmol/L (ref 135–145)
Sodium: 142 mmol/L (ref 135–145)

## 2019-08-17 LAB — CBC
HCT: 41.5 % (ref 39.0–52.0)
Hemoglobin: 13.3 g/dL (ref 13.0–17.0)
MCH: 28.8 pg (ref 26.0–34.0)
MCHC: 32 g/dL (ref 30.0–36.0)
MCV: 89.8 fL (ref 80.0–100.0)
Platelets: 341 10*3/uL (ref 150–400)
RBC: 4.62 MIL/uL (ref 4.22–5.81)
RDW: 12.4 % (ref 11.5–15.5)
WBC: 9.2 10*3/uL (ref 4.0–10.5)
nRBC: 0 % (ref 0.0–0.2)

## 2019-08-17 LAB — GLUCOSE, CAPILLARY: Glucose-Capillary: 150 mg/dL — ABNORMAL HIGH (ref 70–99)

## 2019-08-17 LAB — ECHOCARDIOGRAM COMPLETE
Area-P 1/2: 2.52 cm2
Calc EF: 48.2 %
Height: 72 in
S' Lateral: 3.6 cm
Single Plane A2C EF: 46.1 %
Single Plane A4C EF: 49.3 %
Weight: 4613.79 oz

## 2019-08-17 LAB — MAGNESIUM: Magnesium: 2.2 mg/dL (ref 1.7–2.4)

## 2019-08-17 LAB — POTASSIUM: Potassium: 3.4 mmol/L — ABNORMAL LOW (ref 3.5–5.1)

## 2019-08-17 LAB — MRSA PCR SCREENING: MRSA by PCR: NEGATIVE

## 2019-08-17 LAB — TROPONIN I (HIGH SENSITIVITY): Troponin I (High Sensitivity): 66 ng/L — ABNORMAL HIGH (ref <18)

## 2019-08-17 MED ORDER — FUROSEMIDE 40 MG PO TABS
40.0000 mg | ORAL_TABLET | Freq: Every day | ORAL | Status: DC
  Administered 2019-08-17 – 2019-08-18 (×2): 40 mg via ORAL
  Filled 2019-08-17 (×2): qty 1

## 2019-08-17 MED ORDER — FUROSEMIDE 40 MG PO TABS: 40.0000 mg | ORAL_TABLET | Freq: Every day | ORAL | Status: DC

## 2019-08-17 MED ORDER — CLONIDINE HCL 0.1 MG PO TABS
0.1000 mg | ORAL_TABLET | Freq: Two times a day (BID) | ORAL | Status: DC
  Administered 2019-08-17 – 2019-08-18 (×3): 0.1 mg via ORAL
  Filled 2019-08-17 (×3): qty 1

## 2019-08-17 MED ORDER — POTASSIUM CHLORIDE 10 MEQ/100ML IV SOLN
10.0000 meq | INTRAVENOUS | Status: AC
  Administered 2019-08-17 – 2019-08-18 (×3): 10 meq via INTRAVENOUS
  Filled 2019-08-17 (×3): qty 100

## 2019-08-17 MED ORDER — LISINOPRIL 20 MG PO TABS
20.0000 mg | ORAL_TABLET | Freq: Every day | ORAL | Status: DC
  Administered 2019-08-17 – 2019-08-18 (×2): 20 mg via ORAL
  Filled 2019-08-17 (×3): qty 1

## 2019-08-17 MED ORDER — AMLODIPINE BESYLATE 10 MG PO TABS
10.0000 mg | ORAL_TABLET | Freq: Every day | ORAL | Status: DC
  Administered 2019-08-17 – 2019-08-18 (×2): 10 mg via ORAL
  Filled 2019-08-17 (×2): qty 1

## 2019-08-17 NOTE — Progress Notes (Signed)
eLink Physician-Brief Progress Note Patient Name: Ryan Horn DOB: Sep 11, 1975 MRN: [id number]   Date of Service  08/17/2019  HPI/Events of Note  K 3.4, no pvc's, brady resolved.  eICU Interventions  Kcl 10 meq q1 hr x 3 doses ordered. Follow AM labs.      Intervention Category Intermediate Interventions: Electrolyte abnormality - evaluation and management  Ranee Gosselin 08/17/2019, 10:01 PM

## 2019-08-17 NOTE — Progress Notes (Signed)
NAME:  Ryan Horn, MRN:  [id number], DOB:  10/28/1975, LOS: 1 ADMISSION DATE:  08/16/2019, CONSULTATION DATE:  08/16/2019 REFERRING MD:  EDP, CHIEF COMPLAINT:  AMS, Drug OD   Brief History   43yo male presented to ED 7/16 with c/o acute on chronic BLE pain and swelling and SOB. He admitted to heroin use and "snorting pain pills" on the morning of presentation. During ER w/u he became increasingly somnolent, initially responding to 1 dose narcan but became somnolent again and required narcan gtt. Did not require intubation for airway protection.  History of present illness   44yo male with hx mental illness, homelessness, CHF, HTN, OSA (noncompliant with CPAP), medication non compliance who initially presented to ED 7/16 with c/o acute on chronic BLE pain and swelling and SOB. He admitted to heroin use and "snorting pain pills" on the morning of presentation. During ER w/u he became increasingly somnolent, initially responding to 1 dose narcan but became somnolent again and required narcan gtt. ABG with mild compensated hypercarbia. UDS neg for opiates, benzos.  Past Medical History  Bipolar 1 disorder (HCC), Chronic back pain, Depression, Hypertension, Obesity, Schizophrenia (HCC), Shortness of breath, and Sleep apnea  Significant Hospital Events   No new events  Consults:  PCCM  Procedures:  None  Significant Diagnostic Tests:  Head CT 7/16: No acute intracranial abnormality.  Micro Data:  MRSA PCR negative  Antimicrobials:  Nne   Interim history/subjective:  Obtunded; withdraws to noxious stimuli  Objective   Blood pressure 119/76, pulse 74, temperature 98.2 F (36.8 C), temperature source Oral, resp. rate 16, height 6' (1.829 m), weight 130.8 kg, SpO2 99 %.        Intake/Output Summary (Last 24 hours) at 08/17/2019 1109 Last data filed at 08/17/2019 0800 Gross per 24 hour  Intake 1980.9 ml  Output 2200 ml  Net -219.1 ml   Filed Weights   08/16/19 2130  08/16/19 2215 08/17/19 0414  Weight: 130.8 kg 130.8 kg 130.8 kg    Examination: General: No obvious distress HENT: /AT; midpoint pupils, minimally reactive Lungs: Clear Cardiovascular: RRR; No m/r/g Abdomen: Supple, no guarding Extremities: No edema Neuro: Obtunded, intermittently withdraws to pain, moves extremities x 4  Resolved Hospital Problem list   None  Assessment & Plan:  1) Acute toxic-metabolic encephalopathy secondary to drug overdose Hx cocaine use Hx bipolar, schizophrenia, depression Self-reports snorting pain pills prior to admission. UDS + amphetamines. Currently protecting airway. ABG with chronic hypercapnia  --Continue narcan gtt --Hold sedating meds including home psych for now --Neuro checks  2) HTN Reported heart failure, no records --2D ECHO obtained this AM --Continue home amlodipine, carvedilol --Hold HCTZ, lisinopril and spironolactone while inpatient  Hypokalemia --Re-check BMPtoday OSA --CPAP  Best practice:  Diet: NPO Pain/Anxiety/Delirium protocol (if indicated): VAP protocol (if indicated): n/a DVT prophylaxis: Lovenox GI prophylaxis: n/a Glucose control: controlled Mobility: BR Code Status: Full Family Communication: Will update Disposition:   Labs   CBC: Recent Labs  Lab 08/16/19 1256 08/16/19 1652 08/16/19 2342  WBC 7.2  --  9.2  HGB 13.2 13.6 13.3  HCT 40.7 40.0 41.5  MCV 91.3  --  89.8  PLT 300  --  341    Basic Metabolic Panel: Recent Labs  Lab 08/16/19 1256 08/16/19 1652 08/16/19 2342  NA 139 142 142  K 3.2* 3.0* 3.3*  CL 101  --  103  CO2 30  --  28  GLUCOSE 87  --  119*  BUN 10  --  7  CREATININE 1.06  --  1.01  CALCIUM 8.7*  --  9.1   GFR: Estimated Creatinine Clearance: 131.9 mL/min (by C-G formula based on SCr of 1.01 mg/dL). Recent Labs  Lab 08/16/19 1256 08/16/19 2342  WBC 7.2 9.2    Liver Function Tests: Recent Labs  Lab 08/16/19 2034  AST 29  ALT 22  ALKPHOS 70  BILITOT 0.7   PROT 8.0  ALBUMIN 4.1   No results for input(s): LIPASE, AMYLASE in the last 168 hours. Recent Labs  Lab 08/16/19 2034  AMMONIA 63*    ABG    Component Value Date/Time   PHART 7.361 08/16/2019 1652   PCO2ART 56.6 (H) 08/16/2019 1652   PO2ART 66 (L) 08/16/2019 1652   HCO3 32.0 (H) 08/16/2019 1652   TCO2 34 (H) 08/16/2019 1652   O2SAT 91.0 08/16/2019 1652     Coagulation Profile: No results for input(s): INR, PROTIME in the last 168 hours.  Cardiac Enzymes: No results for input(s): CKTOTAL, CKMB, CKMBINDEX, TROPONINI in the last 168 hours.  HbA1C: Hgb A1c MFr Bld  Date/Time Value Ref Range Status  05/30/2018 03:44 PM 5.0 4.8 - 5.6 % Final    Comment:             Prediabetes: 5.7 - 6.4          Diabetes: >6.4          Glycemic control for adults with diabetes: <7.0   07/17/2013 06:35 AM 5.3 <5.7 % Final    Comment:    (NOTE)                                                                       According to the ADA Clinical Practice Recommendations for 2011, when HbA1c is used as a screening test:  >=6.5%   Diagnostic of Diabetes Mellitus           (if abnormal result is confirmed) 5.7-6.4%   Increased risk of developing Diabetes Mellitus References:Diagnosis and Classification of Diabetes Mellitus,Diabetes Care,2011,34(Suppl 1):S62-S69 and Standards of Medical Care in         Diabetes - 2011,Diabetes Care,2011,34 (Suppl 1):S11-S61.    CBG: Recent Labs  Lab 08/16/19 2134 08/17/19 0337  GLUCAP 83 150*    Review of Systems:   Unable to obtain due to altered mental status  Past Medical History  He,  has a past medical history of Bipolar 1 disorder (HCC), Chronic back pain, Depression, Hypertension, Obesity, Schizophrenia (HCC), Shortness of breath, and Sleep apnea.   Surgical History    Past Surgical History:  Procedure Laterality Date  . CHOLECYSTECTOMY       Social History   reports that he has been smoking cigarettes. He has a 5.50 pack-year  smoking history. He has never used smokeless tobacco. He reports current alcohol use. He reports current drug use. Drugs: "Crack" cocaine and Marijuana.   Family History   His family history includes Anesthesia problems in his mother.   Allergies Allergies  Allergen Reactions  . Hydrocodone Itching  . Tramadol Nausea And Vomiting  . Tylenol [Acetaminophen] Nausea And Vomiting    Stomach ache     Home Medications  Prior to Admission medications   Medication Sig Start Date  End Date Taking? Authorizing Provider  amLODipine (NORVASC) 5 MG tablet Take 1 tablet (5 mg total) by mouth daily. To lower blood pressure 05/30/18   Fulp, Cammie, MD  ARIPiprazole (ABILIFY) 10 MG tablet Take 1 tablet (10 mg total) by mouth daily. 07/18/13   Withrow, Everardo All, FNP  atorvastatin (LIPITOR) 80 MG tablet Take 1 tablet (80 mg total) by mouth daily. To lower cholesterol 05/30/18   Fulp, Cammie, MD  busPIRone (BUSPAR) 10 MG tablet Take 1 tablet (10 mg total) by mouth 2 (two) times daily. 05/30/18   Fulp, Cammie, MD  carvedilol (COREG) 25 MG tablet Take 1 tablet (25 mg total) by mouth 2 (two) times daily with a meal. To lower blood pressure 05/30/18   Fulp, Cammie, MD  cloNIDine (CATAPRES - DOSED IN MG/24 HR) 0.1 mg/24hr patch Place 1 patch (0.1 mg total) onto the skin once a week. 07/18/13   Withrow, Everardo All, FNP  divalproex (DEPAKOTE) 500 MG DR tablet Take 1 tablet (500 mg total) by mouth 2 (two) times daily. 07/18/13   Withrow, Everardo All, FNP  furosemide (LASIX) 40 MG tablet Take 1 tablet (40 mg total) by mouth daily. 05/30/18   Fulp, Cammie, MD  hydrALAZINE (APRESOLINE) 25 MG tablet Take 1 tablet (25 mg total) by mouth every 8 (eight) hours. 07/18/13   Withrow, Everardo All, FNP  hydrochlorothiazide (HYDRODIURIL) 25 MG tablet Take 1 tablet (25 mg total) by mouth daily. 07/18/13   Withrow, Everardo All, FNP  lidocaine (LIDODERM) 5 % Place 1 patch onto the skin daily. Apply to back as instructed for pain. Remove & discard patch within 12  hours. 05/04/18   Antony Madura, PA-C  lisinopril (ZESTRIL) 20 MG tablet Take 1 tablet (20 mg total) by mouth daily. To lower blood pressure 05/30/18   Fulp, Cammie, MD  oxyCODONE (OXY IR/ROXICODONE) 5 MG immediate release tablet Take 1 tablet (5 mg total) by mouth every 4 (four) hours as needed for severe pain. 07/12/13   Dione Booze, MD  sertraline (ZOLOFT) 50 MG tablet Take 1 tablet (50 mg total) by mouth at bedtime. 07/18/13   Withrow, Everardo All, FNP  silver sulfADIAZINE (SILVADENE) 1 % cream Apply 1 application topically daily. For burned areas 07/18/13   Withrow, Everardo All, FNP  spironolactone (ALDACTONE) 25 MG tablet Take 1 tablet (25 mg total) by mouth daily. 05/30/18   Cain Saupe, MD     Critical care time: 40 min  Netty Starring, M.D.

## 2019-08-17 NOTE — Progress Notes (Signed)
  Echocardiogram 2D Echocardiogram has been performed.  Stark Bray Swaim 08/17/2019, 10:26 AM

## 2019-08-17 NOTE — Progress Notes (Signed)
Elink notified that labs were back and that patient keeps dropping his HR to low 50's, unsustained. Waiting on new orders.

## 2019-08-17 NOTE — Progress Notes (Signed)
eLink Physician-Brief Progress Note Patient Name: Ryan Horn DOB: [birth year] MRN: [id number]   Date of Service  08/17/2019  HPI/Events of Note  EKG: sinus brady, PVC's. Last K was low.  Discussed with bed side RN.  eICU Interventions  - stat K/Mag level, replace if low.      Intervention Category Intermediate Interventions: Arrhythmia - evaluation and management Minor Interventions: Communication with other healthcare providers and/or family  Ranee Gosselin 08/17/2019, 8:15 PM

## 2019-08-17 NOTE — Progress Notes (Signed)
Elink notified of patients HR dropping in the 40's, unsustained.  Patient is asymptomatic.  EKG obtained. Labs orders placed.

## 2019-08-17 NOTE — Progress Notes (Signed)
Patient arrived via stretcher to 3M04 from the ED. Report taken from [place], [place].  Patient is able to wake up and request food but falls right back to a deep sleep.  Pupils are equal and reactive to light. He is alert and oriented X3. Moves all extremities.  Two PIV's are C/D/I, flush with good blood return.  Narcan gtt @ 15.88mls.  Patient is on 3L [place]. No skin issues noted. Sacral foam placed on sacrum. Patient has cellphone within reach and bookbag with shoes in room. Continuing to monitor patient

## 2019-08-18 DIAGNOSIS — T50901A Poisoning by unspecified drugs, medicaments and biological substances, accidental (unintentional), initial encounter: Secondary | ICD-10-CM

## 2019-08-18 LAB — BASIC METABOLIC PANEL
Anion gap: 8 (ref 5–15)
BUN: 16 mg/dL (ref 6–20)
CO2: 28 mmol/L (ref 22–32)
Calcium: 9.4 mg/dL (ref 8.9–10.3)
Chloride: 101 mmol/L (ref 98–111)
Creatinine, Ser: 1.31 mg/dL — ABNORMAL HIGH (ref 0.61–1.24)
GFR calc Af Amer: >60 mL/min (ref >60)
GFR calc non Af Amer: >60 mL/min (ref >60)
Glucose, Bld: 127 mg/dL — ABNORMAL HIGH (ref 70–99)
Potassium: 3.5 mmol/L (ref 3.5–5.1)
Sodium: 137 mmol/L (ref 135–145)

## 2019-08-18 MED ORDER — CHLORHEXIDINE GLUCONATE CLOTH 2 % EX PADS: 6.0000 | MEDICATED_PAD | Freq: Every day | CUTANEOUS | Status: DC
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
## 2019-08-18 NOTE — Discharge Summary (Signed)
Physician Discharge Summary  Patient ID: Ryan Horn MRN: [id number] DOB/AGE: 1975-12-15 44 y.o.  Admit date: 08/16/2019 Discharge date: 08/18/2019  Admission Diagnoses: drug overdose  Discharge Diagnoses:  Active Problems:   Drug overdose   Shortness of breath  Discharged Condition: stable  Hospital Course:  Ryan Horn is a 44 yo man with PMH of BPD type 1, schizophrenia, homelessness, CHF, [place], OSA (noncompliant with CPAP), medication non compliance who was admitted on 7/16 for acute toxic-metabolic encephalopathy secondary to drug overdose. ABG with mild compensated hypercarbia. He presented initially to ED with SOB and complaints of b/l leg pain, but became somnolent and obtunded. He was admitted to ICU and required narcan gtt for 2 days. He reported snorting heroin; UDS (+) for amphetamines. Patient had recovered mental faculties and was stable for transfer to floor, but elected to leave AMA today despite counseling on the risks of leaving the hospital early. He was able to state the risks of leaving AMA and competent to make that decision.   CHF: reported history, but no records of such. Echo with normal EF, only mild LVH.   HTN: patient historically on a number of medications, but not taking any in >1 month. Recommend establishing care with a PCP to assess level of HTN and tx.  Mental Health: patient has h/o BPD, schizophrenia, drug use. Previously on abilify, seroquel, buspar, depakote but it has been greater than 1 month since he last used them. Recommend patient follow up with mental health care provider before any of these are re-prescribed.   Consults: pulmonary/intensive care  Significant Diagnostic Studies: cardiac graphics: Echocardiogram:  EF 55-60%, mild LVH CT Head- no acute abnormality  Treatments: Narcan gtt  Discharge Exam: Blood pressure 124/72, pulse 69, temperature 98.3 F (36.8 C), temperature source Oral, resp. rate 20, height 6' (1.829 m), weight  131 kg, SpO2 99 %.  HEENT: [place]/AT, PRRL, EOMI Lungs: Clear bilaterally Cor: RRR, no m/r/g Abd: Benign w/o guarding Extrem: No/c/c/e Neuro: A&Ox3. No focal findings  Disposition: AMA  Allergies as of 08/18/2019      Reactions   Hydrocodone Itching   Tramadol Nausea And Vomiting   Tylenol [acetaminophen] Nausea And Vomiting   Stomach ache      Medication List    STOP taking these medications   amLODipine 5 MG tablet Commonly known as: NORVASC   ARIPiprazole 10 MG tablet Commonly known as: ABILIFY   atorvastatin 80 MG tablet Commonly known as: LIPITOR   busPIRone 10 MG tablet Commonly known as: BUSPAR   carvedilol 25 MG tablet Commonly known as: COREG   cloNIDine 0.1 mg/24hr patch Commonly known as: CATAPRES - Dosed in mg/24 hr   divalproex 500 MG DR tablet Commonly known as: DEPAKOTE   furosemide 40 MG tablet Commonly known as: LASIX   hydrALAZINE 25 MG tablet Commonly known as: APRESOLINE   hydrochlorothiazide 25 MG tablet Commonly known as: HYDRODIURIL   lidocaine 5 % Commonly known as: Lidoderm   lisinopril 20 MG tablet Commonly known as: ZESTRIL   oxyCODONE 5 MG immediate release tablet Commonly known as: Oxy IR/ROXICODONE   sertraline 50 MG tablet Commonly known as: ZOLOFT   silver sulfADIAZINE 1 % cream Commonly known as: SILVADENE   spironolactone 25 MG tablet Commonly known as: ALDACTONE       SignedShirlean Mylar 08/18/2019, 1:33 PM

## 2019-08-18 NOTE — Progress Notes (Signed)
eLink Physician-Brief Progress Note Patient Name: Ryan Horn DOB: 08-19-1975 MRN: [id number]   Date of Service  08/18/2019  HPI/Events of Note  Camera for HR 50. Sinus brady now at 60, MAP > 70. sats fine on BiPAP.  Amphetamine + on Tox screen. Heroin snorts.  eICU Interventions  Continue to watch for now as long as stable MAP. If gets irregular need EKG.  Discussed with bed side RN.      Intervention Category Intermediate Interventions: Arrhythmia - evaluation and management  Ranee Gosselin 08/18/2019, 1:53 AM

## 2019-08-18 NOTE — Progress Notes (Signed)
Placed patient on CPAP for the night with oxygen set at 4lpm ° °

## 2019-08-18 NOTE — Progress Notes (Signed)
Patient in agreement with plan for continued hospitalization and transfer to floor status this morning. However, he has now changed his mind and wishes to leave AMA. Discussed with patient potential risks of leaving AMA, he recognizes the benefit of staying and can state possible harms if he leaves. He is AOx3 and competent to make this decision.   Shirlean Mylar, MD [facility], PGY-2

## 2019-08-18 NOTE — Progress Notes (Signed)
Pt informed me he wishes to leave AMA. MD notified and educated him on the risks. Pt states he still wishes to leave AMA, and signed form. Form was placed in patients file and charge RN was made aware. RN accompanied pt downstairs with belongings via wheelchair. Family notified.

## 2019-08-21 LAB — DRUG SCREEN 10 W/CONF, SERUM
Amphetamines, IA: NEGATIVE ng/mL
Barbiturates, IA: NEGATIVE ug/mL
Benzodiazepines, IA: NEGATIVE ng/mL
Cocaine & Metabolite, IA: NEGATIVE ng/mL
Methadone, IA: NEGATIVE ng/mL
Opiates, IA: NEGATIVE ng/mL
Oxycodones, IA: NEGATIVE ng/mL
Phencyclidine, IA: NEGATIVE ng/mL
Propoxyphene, IA: NEGATIVE ng/mL
THC(Marijuana) Metabolite, IA: NEGATIVE ng/mL
